# Patient Record
Sex: Male | Born: 1955 | Race: White | Hispanic: No | Marital: Married | Smoking: Never smoker
Health system: Southern US, Community
[De-identification: ages and names within clinical notes are randomized; demographics above are authoritative.]

## PROBLEM LIST (undated history)

## (undated) DIAGNOSIS — N401 Enlarged prostate with lower urinary tract symptoms: Secondary | ICD-10-CM

## (undated) DIAGNOSIS — E559 Vitamin D deficiency, unspecified: Secondary | ICD-10-CM

## (undated) DIAGNOSIS — N138 Other obstructive and reflux uropathy: Secondary | ICD-10-CM

## (undated) DIAGNOSIS — Z1211 Encounter for screening for malignant neoplasm of colon: Secondary | ICD-10-CM

## (undated) DIAGNOSIS — E782 Mixed hyperlipidemia: Secondary | ICD-10-CM

## (undated) DIAGNOSIS — M7040 Prepatellar bursitis, unspecified knee: Secondary | ICD-10-CM

## (undated) HISTORY — PX: LIPOMA EXCISION: SHX5283

## (undated) HISTORY — DX: Other obstructive and reflux uropathy: N40.1

## (undated) HISTORY — PX: OTHER SURGICAL HISTORY: SHX169

## (undated) HISTORY — DX: Benign prostatic hyperplasia with lower urinary tract symptoms: N13.8

## (undated) HISTORY — DX: Vitamin D deficiency, unspecified: E55.9

## (undated) HISTORY — DX: Encounter for screening for malignant neoplasm of colon: Z12.11

## (undated) HISTORY — PX: APPENDECTOMY: SHX54

## (undated) HISTORY — DX: Prepatellar bursitis, unspecified knee: M70.40

## (undated) HISTORY — PX: BLEPHAROPLASTY: SUR158

## (undated) HISTORY — DX: Mixed hyperlipidemia: E78.2

---

## 2009-01-21 HISTORY — PX: COLONOSCOPY: SHX174

## 2019-08-22 DIAGNOSIS — Z8616 Personal history of COVID-19: Secondary | ICD-10-CM

## 2019-08-22 HISTORY — DX: Personal history of COVID-19: Z86.16

## 2019-09-02 ENCOUNTER — Other Ambulatory Visit: Payer: Self-pay

## 2019-09-02 ENCOUNTER — Encounter (HOSPITAL_COMMUNITY): Payer: Self-pay | Admitting: Obstetrics and Gynecology

## 2019-09-02 DIAGNOSIS — U071 COVID-19: Secondary | ICD-10-CM | POA: Insufficient documentation

## 2019-09-02 DIAGNOSIS — J1282 Pneumonia due to coronavirus disease 2019: Secondary | ICD-10-CM | POA: Diagnosis not present

## 2019-09-02 DIAGNOSIS — E559 Vitamin D deficiency, unspecified: Secondary | ICD-10-CM | POA: Insufficient documentation

## 2019-09-02 DIAGNOSIS — R05 Cough: Secondary | ICD-10-CM | POA: Diagnosis present

## 2019-09-02 NOTE — ED Triage Notes (Signed)
Patient reports from urgent care for a CTA of chest. Patient was told he had an elevated d-dimer, but patient is COVID positive since saturday

## 2019-09-03 ENCOUNTER — Emergency Department (HOSPITAL_COMMUNITY)
Admission: EM | Admit: 2019-09-03 | Discharge: 2019-09-03 | Disposition: A | Discharge status: Home / Self Care | Attending: Emergency Medicine | Admitting: Emergency Medicine

## 2019-09-03 ENCOUNTER — Emergency Department (HOSPITAL_COMMUNITY)

## 2019-09-03 DIAGNOSIS — J1282 Pneumonia due to coronavirus disease 2019: Secondary | ICD-10-CM

## 2019-09-03 LAB — CBC WITH DIFFERENTIAL/PLATELET
Abs Immature Granulocytes: 0.03 10*3/uL (ref 0.00–0.07)
Basophils Absolute: 0 10*3/uL (ref 0.0–0.1)
Basophils Relative: 1 %
Eosinophils Absolute: 0.1 10*3/uL (ref 0.0–0.5)
Eosinophils Relative: 1 %
HCT: 44.3 % (ref 39.0–52.0)
Hemoglobin: 14.6 g/dL (ref 13.0–17.0)
Immature Granulocytes: 1 %
Lymphocytes Relative: 18 %
Lymphs Abs: 1.2 10*3/uL (ref 0.7–4.0)
MCH: 29 pg (ref 26.0–34.0)
MCHC: 33 g/dL (ref 30.0–36.0)
MCV: 87.9 fL (ref 80.0–100.0)
Monocytes Absolute: 1.1 10*3/uL — ABNORMAL HIGH (ref 0.1–1.0)
Monocytes Relative: 17 %
Neutro Abs: 4 10*3/uL (ref 1.7–7.7)
Neutrophils Relative %: 62 %
Platelets: 191 10*3/uL (ref 150–400)
RBC: 5.04 MIL/uL (ref 4.22–5.81)
RDW: 13.2 % (ref 11.5–15.5)
WBC: 6.3 10*3/uL (ref 4.0–10.5)
nRBC: 0 % (ref 0.0–0.2)

## 2019-09-03 LAB — COMPREHENSIVE METABOLIC PANEL
ALT: 19 U/L (ref 0–44)
AST: 28 U/L (ref 15–41)
Albumin: 3.6 g/dL (ref 3.5–5.0)
Alkaline Phosphatase: 36 U/L — ABNORMAL LOW (ref 38–126)
Anion gap: 10 (ref 5–15)
BUN: 10 mg/dL (ref 8–23)
CO2: 24 mmol/L (ref 22–32)
Calcium: 8.4 mg/dL — ABNORMAL LOW (ref 8.9–10.3)
Chloride: 103 mmol/L (ref 98–111)
Creatinine, Ser: 0.83 mg/dL (ref 0.61–1.24)
GFR calc Af Amer: >60 mL/min (ref >60)
GFR calc non Af Amer: >60 mL/min (ref >60)
Glucose, Bld: 117 mg/dL — ABNORMAL HIGH (ref 70–99)
Potassium: 3.5 mmol/L (ref 3.5–5.1)
Sodium: 137 mmol/L (ref 135–145)
Total Bilirubin: 0.6 mg/dL (ref 0.3–1.2)
Total Protein: 6.5 g/dL (ref 6.5–8.1)

## 2019-09-03 LAB — D-DIMER, QUANTITATIVE: D-Dimer, Quant: 0.99 ug/mL-FEU — ABNORMAL HIGH (ref 0.00–0.50)

## 2019-09-03 LAB — TROPONIN I (HIGH SENSITIVITY): Troponin I (High Sensitivity): 13 ng/L (ref <18)

## 2019-09-03 MED ORDER — DEXAMETHASONE SODIUM PHOSPHATE 10 MG/ML IJ SOLN
10.0000 mg | Freq: Once | INTRAMUSCULAR | Status: AC
  Administered 2019-09-03: 10 mg via INTRAVENOUS
  Filled 2019-09-03: qty 1

## 2019-09-03 MED ORDER — SODIUM CHLORIDE (PF) 0.9 % IJ SOLN
INTRAMUSCULAR | Status: AC
  Filled 2019-09-03: qty 50

## 2019-09-03 MED ORDER — IOHEXOL 350 MG/ML SOLN
100.0000 mL | Freq: Once | INTRAVENOUS | Status: AC | PRN
  Administered 2019-09-03: 100 mL via INTRAVENOUS

## 2019-09-03 NOTE — Discharge Instructions (Signed)
Your testing is negative for blood clots.  Continue your quarantine at home as you are doing.  Follow-up with your doctor.  Use Tylenol or Motrin as needed for aches and fever. Return to the ED with increased chest pain, difficulty breathing, nausea, vomiting, any other concerns.

## 2019-09-03 NOTE — ED Notes (Signed)
Pt walked from triage to rm 22 02 state 92%

## 2019-09-03 NOTE — ED Provider Notes (Signed)
Ralph [name]   CSN: [id number] Arrival date & time: 09/02/19  1621     History Chief Complaint  Patient presents with  . COVID Positive    Ralph [name] is a 64 y.o. male.  Patient reports Covid symptoms were approximately the last 10 days.  He had a positive test on August 7.  He presented to urgent care today with increased productive cough, shortness of breath and chest pain with coughing.  He had an outpatient positive D-dimer and was referred to the ED for CT scan.  He states he continues to feel short of breath with frequent cough productive of yellow and green mucus.  Has had intermittent fever and chills at home as well as nausea.  Chest pain is only with coughing.  Wife is been sick with similar symptoms.  He has been waiting for over 12 hours before my evaluation and states he is lightheaded from not eating or drinking all day.  He is not a diabetic.  Denies any heart or lung history.  Denies any abdominal pain or chest pain.  Denies any pain with urination or blood in the urine. He was given prescriptions for Decadron, Hycodan, albuterol, Mucinex and Zyrtec which he has not started yet.  The history is provided by the patient.       History reviewed. No pertinent past medical history.  There are no problems to display for this patient.   History reviewed. No pertinent surgical history.     No family history on file.  Social History   Tobacco Use  . Smoking status: Not on file  Substance Use Topics  . Alcohol use: Yes    Comment: Social  . Drug use: Not Currently    Home Medications Prior to Admission medications   Not on File    Allergies    Patient has no allergy information on record.  Review of Systems   Review of Systems  Constitutional: Positive for activity change, appetite change, chills, fatigue and fever.  HENT: Positive for congestion and rhinorrhea.   Eyes: Negative for  visual disturbance.  Respiratory: Positive for cough and shortness of breath. Negative for chest tightness.   Cardiovascular: Negative for chest pain and leg swelling.  Gastrointestinal: Negative for abdominal pain, nausea and vomiting.  Genitourinary: Negative for dysuria and hematuria.  Musculoskeletal: Positive for arthralgias and myalgias.  Skin: Negative for wound.  Neurological: Positive for weakness and headaches. Negative for light-headedness.   all other systems are negative except as noted in the HPI and PMH.    Physical Exam Updated Vital Signs BP (!) 149/89 (BP Location: Left Arm)   Pulse 97   Temp 99.6 F (37.6 C) (Oral)   Resp 16   SpO2 93%   Physical Exam Vitals and nursing [name] reviewed.  Constitutional:      General: He is not in acute distress.    Appearance: He is well-developed.     Comments: No acute distress, speaking full sentences  HENT:     Head: Normocephalic and atraumatic.     Mouth/Throat:     Pharynx: No oropharyngeal exudate.  Eyes:     Conjunctiva/sclera: Conjunctivae normal.     Pupils: Pupils are equal, round, and reactive to light.  Neck:     Comments: No meningismus. Cardiovascular:     Rate and Rhythm: Normal rate and regular rhythm.     Heart sounds: Normal heart sounds. No murmur heard.   Pulmonary:  Effort: Pulmonary effort is normal. No respiratory distress.     Breath sounds: Normal breath sounds. No wheezing.  Abdominal:     Palpations: Abdomen is soft.     Tenderness: There is no abdominal tenderness. There is no guarding or rebound.  Musculoskeletal:        General: No tenderness. Normal range of motion.     Cervical back: Normal range of motion and neck supple.     Right lower leg: No edema.     Left lower leg: No edema.  Skin:    General: Skin is warm.     Capillary Refill: Capillary refill takes less than 2 seconds.  Neurological:     General: No focal deficit present.     Mental Status: He is alert and oriented  to person, place, and time. Mental status is at baseline.     Cranial Nerves: No cranial nerve deficit.     Motor: No abnormal muscle tone.     Coordination: Coordination normal.     Comments: No ataxia on finger to nose bilaterally. No pronator drift. 5/5 strength throughout. CN 2-12 intact.Equal grip strength. Sensation intact.   Psychiatric:        Behavior: Behavior normal.     ED Results / Procedures / Treatments   Labs (all labs ordered are listed, but only abnormal results are displayed) Labs Reviewed  COMPREHENSIVE METABOLIC PANEL - Abnormal; Notable for the following components:      Result Value   Glucose, Bld 117 (*)    Calcium 8.4 (*)    Alkaline Phosphatase 36 (*)    All other components within normal limits  D-DIMER, QUANTITATIVE (NOT AT [facility]) - Abnormal; Notable for the following components:   D-Dimer, Quant 0.99 (*)    All other components within normal limits  CBC WITH DIFFERENTIAL/PLATELET - Abnormal; Notable for the following components:   Monocytes Absolute 1.1 (*)    All other components within normal limits  TROPONIN I (HIGH SENSITIVITY)  TROPONIN I (HIGH SENSITIVITY)    EKG EKG Interpretation  Date/Time:  Friday September 03 2019 05:28:47 EDT Ventricular Rate:  78 PR Interval:    QRS Duration: 100 QT Interval:  380 QTC Calculation: 433 R Axis:   46 Text Interpretation: Sinus rhythm Borderline T abnormalities, inferior leads No previous ECGs available Confirmed by Glynn Octave [phone]) on 09/03/2019 5:39:52 AM   Radiology CT Angio Chest PE W and/or Wo Contrast  Result Date: 09/03/2019 CLINICAL DATA:  PE suspected, low intermediate probability. Positive D-dimer. COVID positive. EXAM: CT ANGIOGRAPHY CHEST WITH CONTRAST TECHNIQUE: Multidetector CT imaging of the chest was performed using the standard protocol during bolus administration of intravenous contrast. Multiplanar CT image reconstructions and MIPs were obtained to evaluate the vascular anatomy.  CONTRAST:  OMNIPAQUE IOHEXOL 350 MG/ML SOLN COMPARISON:  None. FINDINGS: Cardiovascular: Heart size is normal. Aorta and great vessel origins are within normal limits. No significant atherosclerotic disease, aneurysm, or stenosis is present. Pulmonary artery opacification is excellent. No focal filling defects are present to suggest pulmonary embolus. Mediastinum/Nodes: No significant mediastinal, hilar, or axillary adenopathy is present. The thoracic inlet is within normal limits. Esophagus is normal. Lungs/Pleura: Patchy peripheral airspace opacities are most prominent at the lung bases, consistent with COVID-19 pneumonia. No significant consolidation is present. Airways are patent. No significant pleural effusion or pneumothorax is present. Upper Abdomen: No acute abnormality. Musculoskeletal: Vertebral body heights and alignment are normal. Endplate Schmorl's nodes and exaggerated kyphosis are present in the thoracic  spine. Rightward curvature is noted. Ribs and sternum are unremarkable. Review of the MIP images confirms the above findings. IMPRESSION: 1. No pulmonary embolus. 2. Patchy peripheral airspace opacities are most prominent at the lung bases, consistent with COVID-19 pneumonia. 3. Exaggerated kyphosis and curvature of the thoracic spine. Electronically Signed   By: Marin Roberts M.D.   On: 09/03/2019 04:07    Procedures Procedures (including critical care time)  Medications Ordered in ED Medications  sodium chloride (PF) 0.9 % injection (has no administration in time range)  iohexol (OMNIPAQUE) 350 MG/ML injection 100 mL (100 mLs Intravenous Contrast Given 09/03/19 0353)    ED Course  I have reviewed the triage vital signs and the nursing notes.  Pertinent labs & imaging results that were available during my care of the patient were reviewed by me and considered in my medical decision making (see chart for details).    MDM Rules/Calculators/A&P                           Patient Covid positive and sent from urgent care with worsening shortness of breath and cough and elevated D-dimer.  He is in no distress and not hypoxic with clear lungs.  EKG is nonischemic.  Troponin is negative despite many hours of waiting in the ED.  Labs are reassuring.  CT scan shows no pulmonary embolism but does show peripheral airspace opacities consistent with Covid.  Patient with no increased work of breathing.  No respiratory distress.  He is able to ambulate without desaturation.  He was prescribed Decadron in urgent care yesterday.  He is not having hypoxia in the ED.  Low suspicion for pulmonary embolism or ACS.  He does not appear to have any qualifications for monoclonal antibody infusion.  He has been sick for more than 10 days.  He declines monoclonal antibody infusion.  Discussed home quarantine.  Continue antitussives that were prescribed by urgent care.  He was also given Decadron which will be questionably beneficial at this point. Return to the ED with difficulty breathing, chest pain, any concerns   Kapono Luhn [name] was evaluated in Emergency Department on 09/03/2019 for the symptoms described in the history of present illness. He was evaluated in the context of the global COVID-19 pandemic, which necessitated consideration that the patient might be at risk for infection with the SARS-CoV-2 virus that causes COVID-19. Institutional protocols and algorithms that pertain to the evaluation of patients at risk for COVID-19 are in a state of rapid change based on information released by regulatory bodies including the CDC and federal and state organizations. These policies and algorithms were followed during the patient's care in the ED.  Final Clinical Impression(s) / ED Diagnoses Final diagnoses:  Pneumonia due to COVID-19 virus    Rx / DC Orders ED Discharge Orders    None       Circe Chilton, Jeannett Senior, MD 09/03/19 [phone]

## 2019-09-03 NOTE — ED Notes (Signed)
Pt is concerned about medication prescribed at [facility] yesterday.  Will review chart.  NAD noted.  Denies pain and SOB.

## 2019-09-03 NOTE — ED Notes (Addendum)
Per staff, Pt has been given ginger ale and tolerated well.

## 2020-02-15 DIAGNOSIS — N138 Other obstructive and reflux uropathy: Secondary | ICD-10-CM | POA: Insufficient documentation

## 2020-02-15 DIAGNOSIS — R350 Frequency of micturition: Secondary | ICD-10-CM | POA: Insufficient documentation

## 2020-02-15 DIAGNOSIS — N401 Enlarged prostate with lower urinary tract symptoms: Secondary | ICD-10-CM | POA: Insufficient documentation

## 2020-02-15 DIAGNOSIS — R7303 Prediabetes: Secondary | ICD-10-CM | POA: Insufficient documentation

## 2020-11-20 ENCOUNTER — Ambulatory Visit: Admitting: Family Medicine

## 2020-11-27 ENCOUNTER — Encounter: Payer: Self-pay | Admitting: Family Medicine

## 2020-11-27 ENCOUNTER — Other Ambulatory Visit: Payer: Self-pay

## 2020-11-27 ENCOUNTER — Ambulatory Visit (INDEPENDENT_AMBULATORY_CARE_PROVIDER_SITE_OTHER): Admitting: Family Medicine

## 2020-11-27 VITALS — BP 143/87 | HR 61 | Temp 98.1°F | Ht 71.85 in | Wt 198.8 lb

## 2020-11-27 DIAGNOSIS — Z0000 Encounter for general adult medical examination without abnormal findings: Secondary | ICD-10-CM | POA: Diagnosis not present

## 2020-11-27 DIAGNOSIS — R030 Elevated blood-pressure reading, without diagnosis of hypertension: Secondary | ICD-10-CM

## 2020-11-27 DIAGNOSIS — Z1211 Encounter for screening for malignant neoplasm of colon: Secondary | ICD-10-CM

## 2020-11-27 DIAGNOSIS — E7800 Pure hypercholesterolemia, unspecified: Secondary | ICD-10-CM | POA: Diagnosis not present

## 2020-11-27 NOTE — Patient Instructions (Signed)

## 2020-11-27 NOTE — Progress Notes (Signed)
Office Note 11/27/2020  CC: Establish care, CPE   HPI:  Ralph [name] is a 65 y.o. male who is here to establish care and annual health maintenance exam. Patient's most recent primary MD: Dr. Adolph Pollack [facility]). Old records in EMR were reviewed prior to or during today's visit.  Patient states he is doing well. He exercises cardio and weight training regularly. He has a good diet.  Old records state he has a diagnosis of hypercholesterolemia.  He does report his triglycerides were elevated but it seems these were mainly elevated after having COVID and then they had decreased near normal per his report. He donates plasma every 2 weeks and says blood pressure checks there are consistently in the 120s over 70s.  Past Medical History:  Diagnosis Date   BPH with obstruction/lower urinary tract symptoms    Urol-Dr. Logan Bores   History of COVID-19 08/2019   Mixed hyperlipidemia    Vitamin D deficiency     Past Surgical History:  Procedure Laterality Date   APPENDECTOMY     BCC excision     2010   BLEPHAROPLASTY Bilateral    COLONOSCOPY  2011   2011 normal   LIPOMA EXCISION     arm    Family History  Problem Relation Age of Onset   Heart disease Mother    Hearing loss Mother     Social History   Socioeconomic History   Marital status: Married    Spouse name: Not on file   Number of children: Not on file   Years of education: Not on file   Highest education level: Not on file  Occupational History   Not on file  Tobacco Use   Smoking status: Not on file   Smokeless tobacco: Not on file  Substance and Sexual Activity   Alcohol use: Yes    Comment: Social   Drug use: Not Currently   Sexual activity: Yes  Other Topics Concern   Not on file  Social History Narrative   Married, 1 son and 1 daughter.   [place] area since 2003.   Education: HS   Occup: Microbiologist   No T/A/Ds   Social Determinants of Research scientist (physical sciences) Strain: Not on file  Food Insecurity: Not on file  Transportation Needs: Not on file  Physical Activity: Not on file  Stress: Not on file  Social Connections: Not on file  Intimate Partner Violence: Not on file    Outpatient Encounter Medications as of 11/27/2020  Medication Sig   alfuzosin (UROXATRAL) 10 MG 24 hr tablet    Cholecalciferol 1.25 MG (50000 UT) capsule Take by mouth.   COVID-19 mRNA vaccine, Moderna, 100 MCG/0.5ML injection    No facility-administered encounter medications on file as of 11/27/2020.    No Known Allergies  ROS Review of Systems  Constitutional:  Negative for appetite change, chills, fatigue and fever.  HENT:  Negative for congestion, dental problem, ear pain and sore throat.   Eyes:  Negative for discharge, redness and visual disturbance.  Respiratory:  Negative for cough, chest tightness, shortness of breath and wheezing.   Cardiovascular:  Negative for chest pain, palpitations and leg swelling.  Gastrointestinal:  Negative for abdominal pain, blood in stool, diarrhea, nausea and vomiting.  Genitourinary:  Negative for difficulty urinating, dysuria, flank pain, frequency, hematuria and urgency.  Musculoskeletal:  Negative for arthralgias, back pain, joint swelling, myalgias and neck stiffness.  Skin:  Negative for pallor and rash.  Neurological:  Negative for dizziness, speech difficulty, weakness and headaches.  Hematological:  Negative for adenopathy. Does not bruise/bleed easily.  Psychiatric/Behavioral:  Negative for confusion and sleep disturbance. The patient is not nervous/anxious.    PE; Blood pressure (!) 143/87, pulse 61, temperature 98.1 F (36.7 C), height 5' 11.85" (1.825 m), weight 198 lb 12.8 oz (90.2 kg), SpO2 97 %.Body mass index is 27.07 kg/m.  Gen: Alert, well appearing.  Patient is oriented to person, place, time, and situation. AFFECT: pleasant, lucid thought and speech. ENT: Ears: EACs clear, normal epithelium.  TMs  with good light reflex and landmarks bilaterally.  Eyes: no injection, icteris, swelling, or exudate.  EOMI, PERRLA. Nose: no drainage or turbinate edema/swelling.  No injection or focal lesion.  Mouth: lips without lesion/swelling.  Oral mucosa pink and moist.  Dentition intact and without obvious caries or gingival swelling.  Oropharynx without erythema, exudate, or swelling.  Neck: supple/nontender.  No LAD, mass, or TM.  Carotid pulses 2+ bilaterally, without bruits. CV: RRR, no m/r/g.   LUNGS: CTA bilat, nonlabored resps, good aeration in all lung fields. ABD: soft, NT, ND, BS normal.  No hepatospenomegaly or mass.  No bruits. EXT: no clubbing, cyanosis, or edema.  Musculoskeletal: no joint swelling, erythema, warmth, or tenderness.  ROM of all joints intact. Skin - no sores or suspicious lesions or rashes or color changes  Pertinent labs:  No results found for: TSH Lab Results  Component Value Date   WBC 6.3 09/03/2019   HGB 14.6 09/03/2019   HCT 44.3 09/03/2019   MCV 87.9 09/03/2019   PLT 191 09/03/2019   Lab Results  Component Value Date   CREATININE 0.83 09/03/2019   BUN 10 09/03/2019   NA 137 09/03/2019   K 3.5 09/03/2019   CL 103 09/03/2019   CO2 24 09/03/2019   Lab Results  Component Value Date   ALT 19 09/03/2019   AST 28 09/03/2019   ALKPHOS 36 (L) 09/03/2019   BILITOT 0.6 09/03/2019   ASSESSMENT AND PLAN:   Establishing care.  1) HLD, mixed. No old #s at this time. Apparently has not been to the level of requiring meds. He'll return for fasting labs.  2) Elevated bp w/out dx HTN: continue routine monitoring at each plasma donation visit, call if elevated.  3) Health maintenance exam: Reviewed age and gender appropriate health maintenance issues (prudent diet, regular exercise, health risks of tobacco and excessive alcohol, use of seatbelts, fire alarms in home, use of sunscreen).  Also reviewed age and gender appropriate health screening as well as  vaccine recommendations. Vaccines: declined flu today.  Need to get old records. Labs: return for fasting health panel. Prostate ca screening:  he gets annual PSAs via his urologist (BPH) Colon ca screening: normal colonoscopy 2011.  Pt average risk--->he prefers to get cologuard so I ordered this today.  An After Visit Summary was printed and given to the patient.  Return in about 1 year (around 11/27/2021) for annual CPE (fasting).  Signed:  Crissie Sickles, MD           11/27/2020

## 2020-12-04 ENCOUNTER — Ambulatory Visit (INDEPENDENT_AMBULATORY_CARE_PROVIDER_SITE_OTHER)

## 2020-12-04 ENCOUNTER — Other Ambulatory Visit: Payer: Self-pay

## 2020-12-04 DIAGNOSIS — Z0000 Encounter for general adult medical examination without abnormal findings: Secondary | ICD-10-CM | POA: Diagnosis not present

## 2020-12-04 DIAGNOSIS — E7800 Pure hypercholesterolemia, unspecified: Secondary | ICD-10-CM

## 2020-12-04 LAB — COMPREHENSIVE METABOLIC PANEL
ALT: 15 U/L (ref 0–53)
AST: 16 U/L (ref 0–37)
Albumin: 4.5 g/dL (ref 3.5–5.2)
Alkaline Phosphatase: 60 U/L (ref 39–117)
BUN: 17 mg/dL (ref 6–23)
CO2: 30 mEq/L (ref 19–32)
Calcium: 9.2 mg/dL (ref 8.4–10.5)
Chloride: 103 mEq/L (ref 96–112)
Creatinine, Ser: 1.07 mg/dL (ref 0.40–1.50)
GFR: 74.12 mL/min (ref >60.00)
Glucose, Bld: 94 mg/dL (ref 70–99)
Potassium: 4.3 mEq/L (ref 3.5–5.1)
Sodium: 140 mEq/L (ref 135–145)
Total Bilirubin: 0.6 mg/dL (ref 0.2–1.2)
Total Protein: 6.3 g/dL (ref 6.0–8.3)

## 2020-12-04 LAB — CBC WITH DIFFERENTIAL/PLATELET
Basophils Absolute: 0.1 10*3/uL (ref 0.0–0.1)
Basophils Relative: 1.9 % (ref 0.0–3.0)
Eosinophils Absolute: 0.3 10*3/uL (ref 0.0–0.7)
Eosinophils Relative: 6 % — ABNORMAL HIGH (ref 0.0–5.0)
HCT: 46.5 % (ref 39.0–52.0)
Hemoglobin: 15.3 g/dL (ref 13.0–17.0)
Lymphocytes Relative: 28.2 % (ref 12.0–46.0)
Lymphs Abs: 1.5 10*3/uL (ref 0.7–4.0)
MCHC: 32.9 g/dL (ref 30.0–36.0)
MCV: 88.3 fl (ref 78.0–100.0)
Monocytes Absolute: 0.6 10*3/uL (ref 0.1–1.0)
Monocytes Relative: 11.4 % (ref 3.0–12.0)
Neutro Abs: 2.9 10*3/uL (ref 1.4–7.7)
Neutrophils Relative %: 52.5 % (ref 43.0–77.0)
Platelets: 204 10*3/uL (ref 150.0–400.0)
RBC: 5.26 Mil/uL (ref 4.22–5.81)
RDW: 13.6 % (ref 11.5–15.5)
WBC: 5.5 10*3/uL (ref 4.0–10.5)

## 2020-12-04 LAB — LIPID PANEL
Cholesterol: 188 mg/dL (ref 0–200)
HDL: 45.4 mg/dL (ref >39.00)
LDL Cholesterol: 129 mg/dL — ABNORMAL HIGH (ref 0–99)
NonHDL: 142.27
Total CHOL/HDL Ratio: 4
Triglycerides: 64 mg/dL (ref 0.0–149.0)
VLDL: 12.8 mg/dL (ref 0.0–40.0)

## 2020-12-04 LAB — TSH: TSH: 2.31 u[IU]/mL (ref 0.35–5.50)

## 2020-12-05 LAB — COLOGUARD: Cologuard: NEGATIVE

## 2020-12-10 LAB — COLOGUARD: COLOGUARD: NEGATIVE

## 2021-02-14 IMAGING — CT CT ANGIO CHEST
2 of 6 series · 18 of 36 positions shown · IV contrast (OMNIPAQUE 350)
Comparison: None.

CLINICAL DATA: PE suspected, low intermediate probability. Positive
D-dimer. COVID positive.

EXAM:
CT ANGIOGRAPHY CHEST WITH CONTRAST
TECHNIQUE: Multidetector CT imaging of the chest was performed using the
standard protocol during bolus administration of intravenous
contrast. Multiplanar CT image reconstructions and MIPs were
obtained to evaluate the vascular anatomy.
CONTRAST:  100mL OMNIPAQUE IOHEXOL 350 MG/ML SOLN

[Series 5: thins · axial · 0.73mm/px · z∈[+1404,+1665]mm · 17 of 295 slices shown]
[im 17/295  lung]
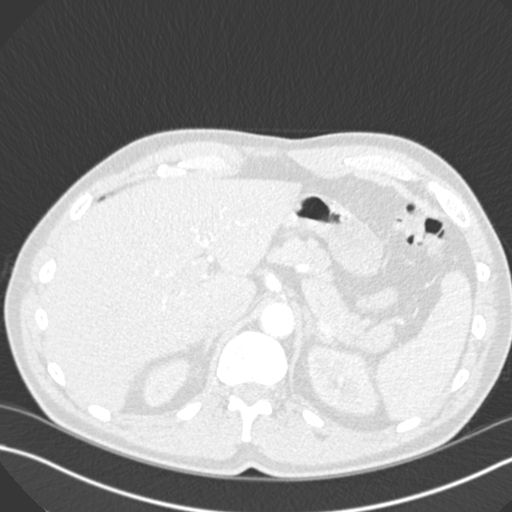
[im 33/295  mediastinal]
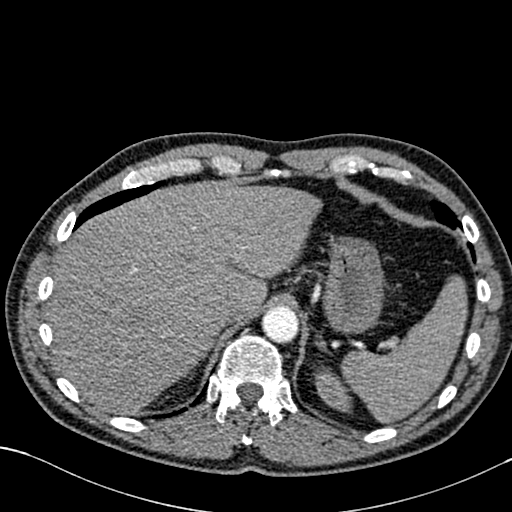
[im 50/295  lung]
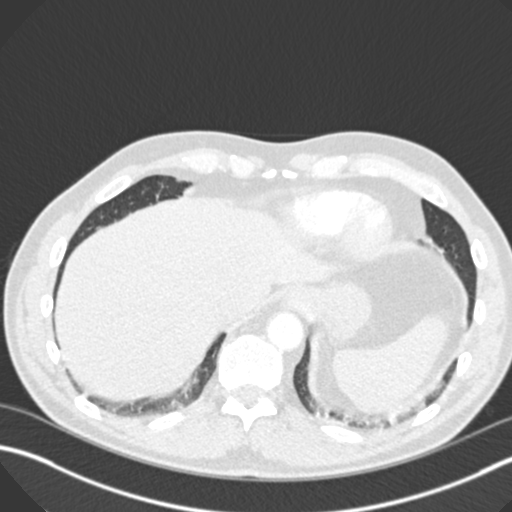
[im 66/295  mediastinal]
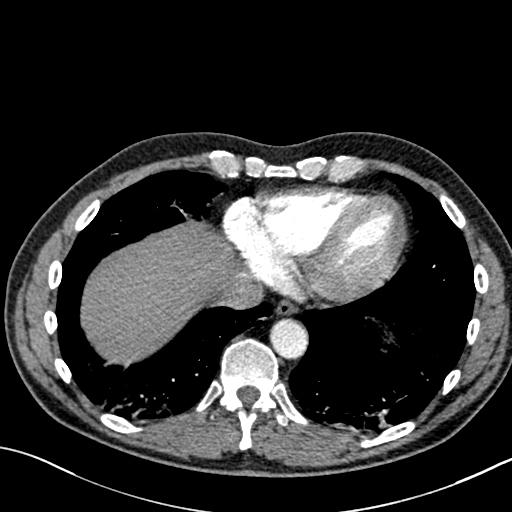
[im 82/295  lung]
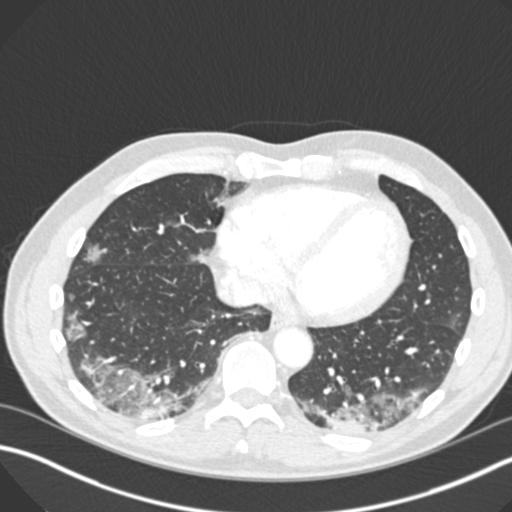
[im 99/295  mediastinal]
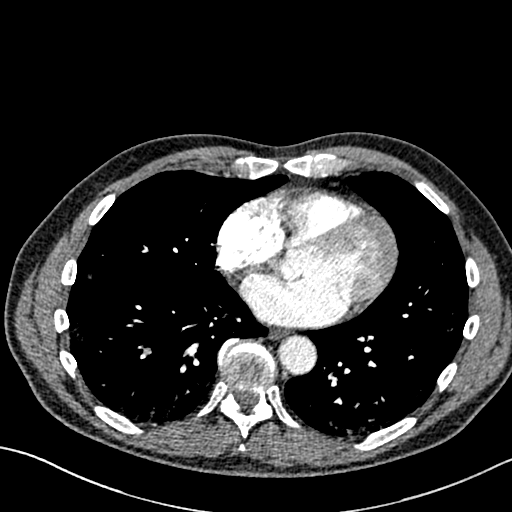
[im 115/295  lung]
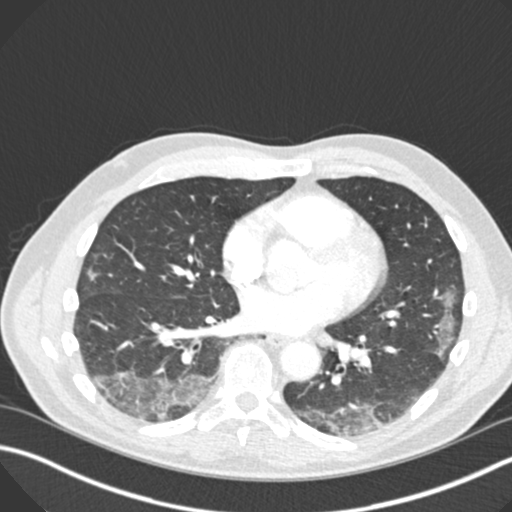
[im 131/295  mediastinal]
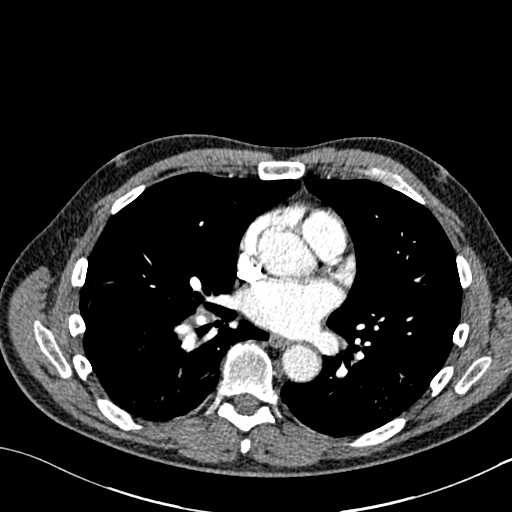
[im 148/295  lung]
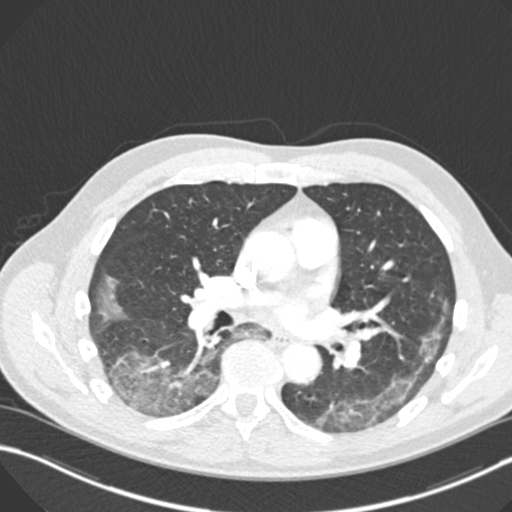
[im 164/295  mediastinal]
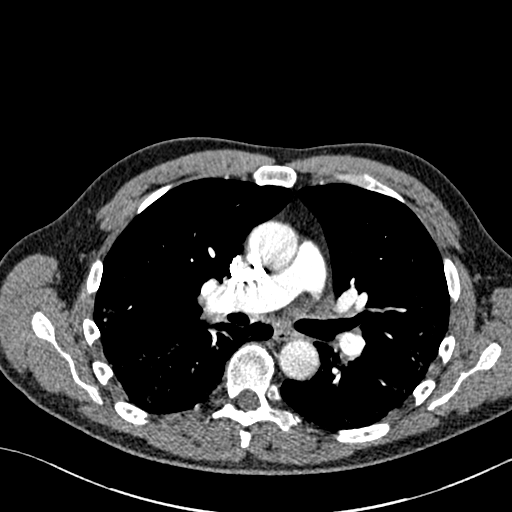
[im 180/295  lung]
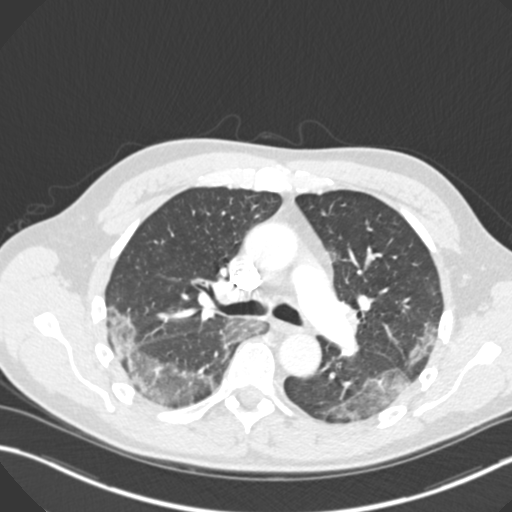
[im 197/295  mediastinal]
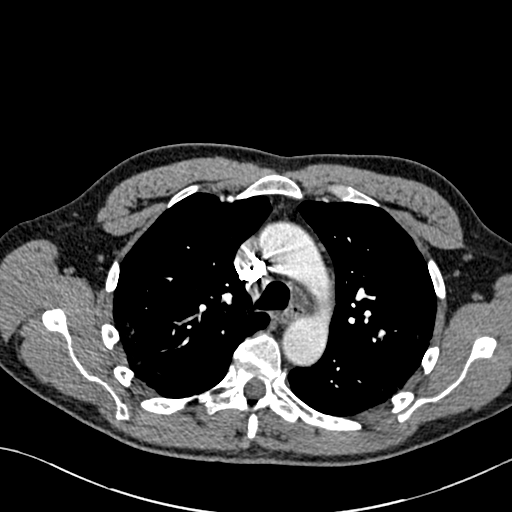
[im 213/295  lung]
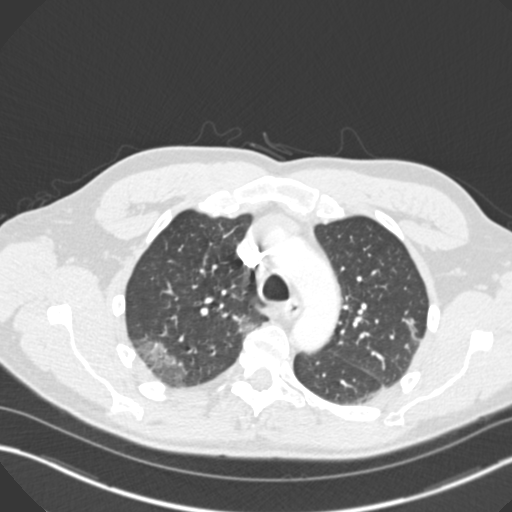
[im 229/295  mediastinal]
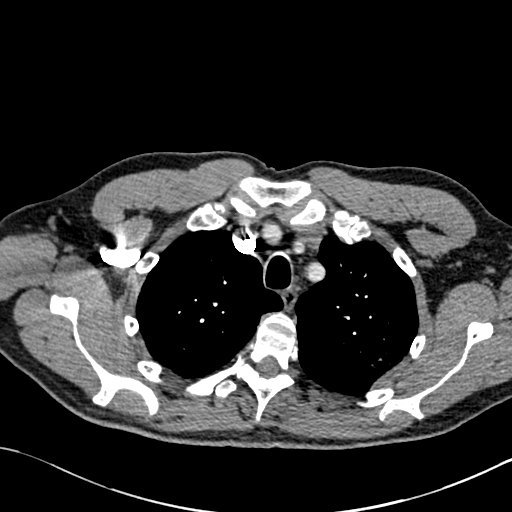
[im 245/295  lung]
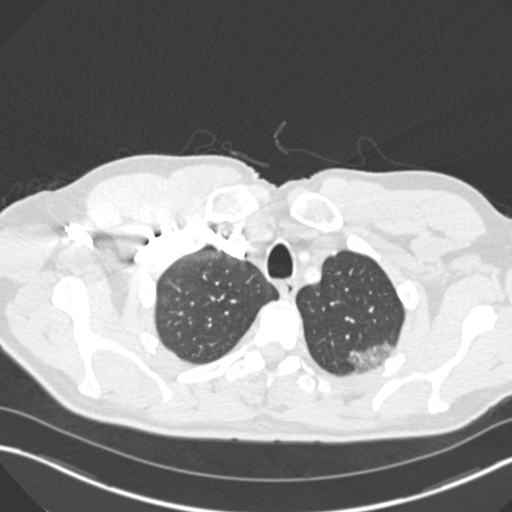
[im 262/295  mediastinal]
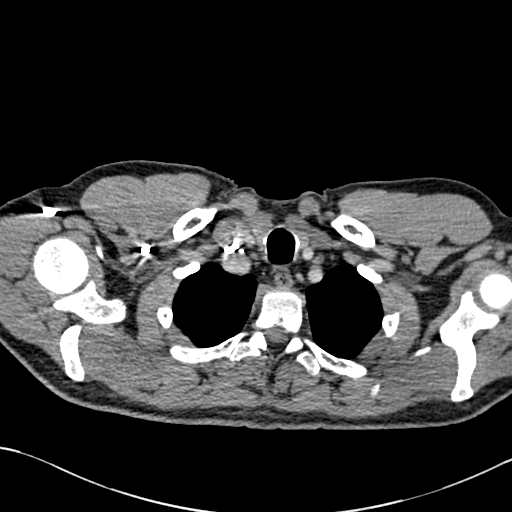
[im 278/295  lung]
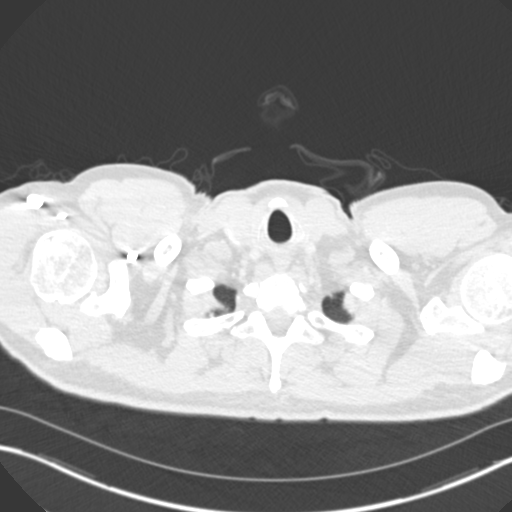

[Series 7: coronal mpr · coronal · 0.59mm/px · 1 of 120 slices shown]
[im 60/120  mediastinal]
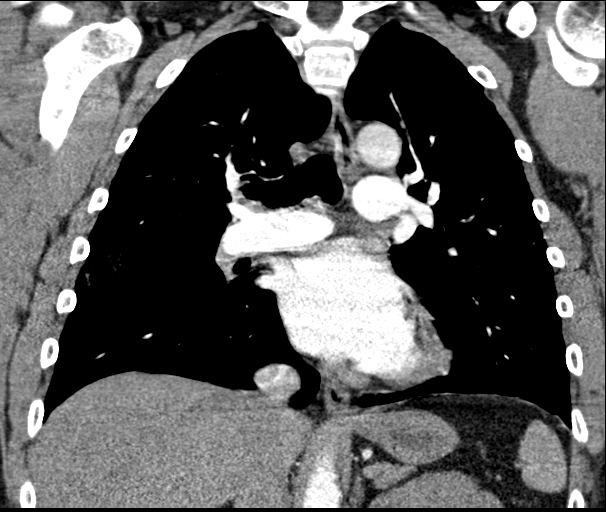

[18 of 36 positions shown; findings below may reference images not displayed]

FINDINGS: Cardiovascular: Heart size is normal. Aorta and great vessel origins
are within normal limits. No significant atherosclerotic disease,
aneurysm, or stenosis is present.

Pulmonary artery opacification is excellent. No focal filling
defects are present to suggest pulmonary embolus.

Mediastinum/Nodes: No significant mediastinal, hilar, or axillary
adenopathy is present. The thoracic inlet is within normal limits.
Esophagus is normal.

Lungs/Pleura: Patchy peripheral airspace opacities are most
prominent at the lung bases, consistent with 6SUPP-EY pneumonia. No
significant consolidation is present. Airways are patent. No
significant pleural effusion or pneumothorax is present.

Upper Abdomen: No acute abnormality.

Musculoskeletal: Vertebral body heights and alignment are normal.
Endplate Schmorl's nodes and exaggerated kyphosis are present in the
thoracic spine. Rightward curvature is noted. Ribs and sternum are
unremarkable.

Review of the MIP images confirms the above findings.
IMPRESSION: 1. No pulmonary embolus.
2. Patchy peripheral airspace opacities are most prominent at the
lung bases, consistent with 6SUPP-EY pneumonia.
3. Exaggerated kyphosis and curvature of the thoracic spine.

## 2021-03-07 ENCOUNTER — Ambulatory Visit (INDEPENDENT_AMBULATORY_CARE_PROVIDER_SITE_OTHER): Admitting: Family Medicine

## 2021-03-07 ENCOUNTER — Other Ambulatory Visit: Payer: Self-pay

## 2021-03-07 VITALS — BP 133/89 | HR 73 | Temp 97.9°F | Ht 71.85 in | Wt 196.6 lb

## 2021-03-07 DIAGNOSIS — G9331 Postviral fatigue syndrome: Secondary | ICD-10-CM | POA: Diagnosis not present

## 2021-03-07 DIAGNOSIS — J9801 Acute bronchospasm: Secondary | ICD-10-CM

## 2021-03-07 DIAGNOSIS — R052 Subacute cough: Secondary | ICD-10-CM

## 2021-03-07 MED ORDER — PREDNISONE 20 MG PO TABS: ORAL_TABLET | ORAL | 0 refills | Status: DC

## 2021-03-07 MED ORDER — BENZONATATE 200 MG PO CAPS: 200.0000 mg | ORAL_CAPSULE | Freq: Three times a day (TID) | ORAL | 1 refills | Status: DC | PRN

## 2021-03-07 NOTE — Progress Notes (Signed)
OFFICE VISIT  03/07/2021  CC:  Chief Complaint  Patient presents with   Cough    Persistent for 1 month. It seems to worsen when lying or sitting down for extended periods of time. Productive with green or white mucus at times. Using Mucinex or Zyrtec otc for relief   Patient is a 66 y.o. male who presents for cough.  HPI: Ralph [name] says he got a URI with cough about a month ago.  His URI symptoms cleared up but cough has persisted.  It waxes and wanes, seems to happen more when he is trying to sit quietly or or lays back.  No fevers chills or malaise.  No chest tightness, wheezing, or shortness of breath. He tried albuterol 2 puffs a couple times and says it did not make much difference. Taking Mucinex, sounds like it helps some but he feels a little bit of hangover effect after taking this medication.  Describes having similar illness may have 2022.  This got better with prednisone and use of albuterol.  Denies any history of exercise-induced wheezing or asthma.  Past Medical History:  Diagnosis Date   BPH with obstruction/lower urinary tract symptoms    [facility]Dr. Amalia [name]   History of COVID-19 08/2019   Mixed hyperlipidemia    Vitamin D deficiency     Past Surgical History:  Procedure Laterality Date   APPENDECTOMY     BCC excision     2010   BLEPHAROPLASTY Bilateral    COLONOSCOPY  2011   2011 normal   LIPOMA EXCISION     arm   Social History   Socioeconomic History   Marital status: Married    Spouse name: Not on file   Number of children: Not on file   Years of education: Not on file   Highest education level: Not on file  Occupational History   Not on file  Tobacco Use   Smoking status: Not on file   Smokeless tobacco: Not on file  Substance and Sexual Activity   Alcohol use: Yes    Comment: Social   Drug use: Not Currently   Sexual activity: Yes  Other Topics Concern   Not on file  Social History Narrative   Married, 1 son and 1 daughter.   [place] since  2003.   Education: HS   Occup: Artist   No T/A/Ds   Social Determinants of Radio broadcast assistant Strain: Unknown   Difficulty of Paying Living Expenses: Patient refused  Food Insecurity: Unknown   Worried About Charity fundraiser in the Last Year: Patient refused   Arboriculturist in the Last Year: Patient refused  Transportation Needs: Unknown   Film/video editor (Medical): Patient refused   Lack of Transportation (Non-Medical): Patient refused  Physical Activity: Insufficiently Active   Days of Exercise per Week: 3 days   Minutes of Exercise per Session: 30 min  Stress: Unknown   Feeling of Stress : Patient refused  Social Connections: Unknown   Frequency of Communication with Friends and Family: Patient refused   Frequency of Social Gatherings with Friends and Family: Patient refused   Attends Religious Services: Patient refused   Marine scientist or Organizations: Patient refused   Attends Archivist Meetings: Not on file   Marital Status: Patient refused    Outpatient Medications Prior to Visit  Medication Sig Dispense Refill   alfuzosin (UROXATRAL) 10 MG 24 hr tablet  Cholecalciferol 1.25 MG (50000 UT) capsule Take by mouth.     COVID-19 mRNA vaccine, Moderna, 100 MCG/0.5ML injection      No facility-administered medications prior to visit.    No Known Allergies  ROS As per HPI  PE: Vitals with BMI 03/07/2021 11/27/2020 09/03/2019  Height 5' 11.85" 5' 11.85" -  Weight 196 lbs 10 oz 198 lbs 13 oz -  BMI [id number] -  Systolic [id number]  Diastolic 89 87 89  Pulse 73 61 99     Physical Exam  Gen: Alert, well appearing.  Patient is oriented to person, place, time, and situation. AFFECT: pleasant, lucid thought and speech. [id number]: no injection, icteris, swelling, or exudate.  EOMI, PERRLA. Mouth: lips without lesion/swelling.  Oral mucosa pink and moist. Oropharynx without erythema, exudate, or swelling.   CV: RRR, no m/r/g.   LUNGS: CTA bilat except end exp coarse wheeze in L>R base.  Does not clear up with a few forceful coughs.  Nonlabored resps, good aeration in all lung fields.  Exp phase is not prolonged. EXT: no clubbing or cyanosis.  no edema.   LABS:  Last CBC Lab Results  Component Value Date   WBC 5.5 12/04/2020   HGB 15.3 12/04/2020   HCT 46.5 12/04/2020   MCV 88.3 12/04/2020   MCH 29.0 09/03/2019   RDW 13.6 12/04/2020   PLT 204.0 [id number]   Last metabolic panel Lab Results  Component Value Date   GLUCOSE 94 12/04/2020   NA 140 12/04/2020   K 4.3 12/04/2020   CL 103 12/04/2020   CO2 30 12/04/2020   BUN 17 12/04/2020   CREATININE 1.07 12/04/2020   GFRNONAA >60 09/03/2019   CALCIUM 9.2 12/04/2020   PROT 6.3 12/04/2020   ALBUMIN 4.5 12/04/2020   BILITOT 0.6 12/04/2020   ALKPHOS 60 12/04/2020   AST 16 12/04/2020   ALT 15 12/04/2020   ANIONGAP 10 09/03/2019    IMPRESSION AND PLAN:  Postviral cough--bronchospasm suspected. Low suspicion of any acute infection. Prednisone 40 mg a day x5 days, then 20 mg a day x5 days. Encouraged him to use his albuterol inhaler 2 puffs every 6 hours as needed. Tessalon Perles 200 mg 3 times daily as needed--May take in place of Mucinex.  An After Visit Summary was printed and given to the patient.  FOLLOW UP: Return if symptoms worsen or fail to improve.  Signed:  Crissie Sickles, MD           03/07/2021

## 2021-10-25 ENCOUNTER — Encounter: Payer: Self-pay | Admitting: Family Medicine

## 2021-10-25 ENCOUNTER — Ambulatory Visit: Admitting: Family Medicine

## 2021-10-25 VITALS — BP 159/95 | HR 55 | Temp 97.8°F | Ht 71.85 in | Wt 195.4 lb

## 2021-10-25 DIAGNOSIS — M7041 Prepatellar bursitis, right knee: Secondary | ICD-10-CM | POA: Diagnosis not present

## 2021-10-25 MED ORDER — MELOXICAM 15 MG PO TABS: 15.0000 mg | ORAL_TABLET | Freq: Every day | ORAL | 0 refills | Status: DC

## 2021-10-25 NOTE — Progress Notes (Signed)
OFFICE VISIT  10/25/2021  CC:  Chief Complaint  Patient presents with   Swelling    Right knee, denies injury. Swelling started 6 weeks ago. He does ice and elevation when he can. This does help some but he remains active so swelling returns. No tenderness but has pressure.    Patient is a 66 y.o. male who presents for right knee swelling.  HPI: 6 weeks ago he started to notice swelling in the anterior aspect of the right knee.  Describes it as a discomfort and fullness, only hurts when he puts a lot of pressure on it.  He has not been red or warm to touch.  He recalls no preceding injury but does a lot of bending work and often works with his knees directly on the ground as well. He has not had much success in being consistent with using compression and ice.  Has also tried maintaining a reasonable activity level during all this and this may be contributing to his lack of resolution with usual measures.  He takes ibuprofen infrequently. No history of any previous problem, no history of gout. He has felt well lately--no fever, malaise, fatigue, or weight loss.   Past Medical History:  Diagnosis Date   BPH with obstruction/lower urinary tract symptoms    [facility]-Dr. Logan Bores   History of COVID-19 08/2019   Mixed hyperlipidemia    Vitamin D deficiency     Past Surgical History:  Procedure Laterality Date   APPENDECTOMY     BCC excision     2010   BLEPHAROPLASTY Bilateral    COLONOSCOPY  2011   2011 normal   LIPOMA EXCISION     arm    Outpatient Medications Prior to Visit  Medication Sig Dispense Refill   alfuzosin (UROXATRAL) 10 MG 24 hr tablet      Cholecalciferol 1.25 MG (50000 UT) capsule Take by mouth.     benzonatate (TESSALON) 200 MG capsule Take 1 capsule (200 mg total) by mouth 3 (three) times daily as needed for cough. 20 capsule 1   predniSONE (DELTASONE) 20 MG tablet 2 tabs po qd x 5d then 1 tab po qd x 5d 15 tablet 0   No facility-administered medications prior to  visit.    No Known Allergies  ROS As per HPI  PE:    10/25/2021    9:47 AM 03/07/2021    4:02 PM 11/27/2020    3:04 PM  Vitals with BMI  Height 5' 11.85" 5' 11.85" 5' 11.85"  Weight 195 lbs 6 oz 196 lbs 10 oz 198 lbs 13 oz  BMI 26.61 26.77 27.07  Systolic 159 133 244  Diastolic 95 89 87  Pulse 55 73 61     Physical Exam  Gen: Alert, well appearing.  Patient is oriented to person, place, time, and situation. AFFECT: pleasant, lucid thought and speech. Right knee with prepatellar fullness and fluctuance.  No erythema or warmth.  No significant tenderness to palpation over this region.  Knee has full range of motion without instability.  LABS:  Last CBC Lab Results  Component Value Date   WBC 5.5 12/04/2020   HGB 15.3 12/04/2020   HCT 46.5 12/04/2020   MCV 88.3 12/04/2020   MCH 29.0 09/03/2019   RDW 13.6 12/04/2020   PLT 204.0 12/04/2020   Last metabolic panel Lab Results  Component Value Date   GLUCOSE 94 12/04/2020   NA 140 12/04/2020   K 4.3 12/04/2020   CL 103 12/04/2020  CO2 30 12/04/2020   BUN 17 12/04/2020   CREATININE 1.07 12/04/2020   GFRNONAA >60 09/03/2019   CALCIUM 9.2 12/04/2020   PROT 6.3 12/04/2020   ALBUMIN 4.5 12/04/2020   BILITOT 0.6 12/04/2020   ALKPHOS 60 12/04/2020   AST 16 12/04/2020   ALT 15 12/04/2020   ANIONGAP 10 09/03/2019   IMPRESSION AND PLAN:  Prepatellar bursitis of right knee. We will have him get a little more intense/aggressive with ice, compression, and elevation, and relative rest. Additionally, I aspirated the bursa today and sent the fluid for analysis--> cell count and differential, crystals, and culture. Meloxicam 15 mg once a day x10 days prescribed.  Bedside ultrasound today showed moderate-sized anechoic distention of the prepatellar bursa, some linear hypoechoic septae noted within this.  No suprapatellar effusion, no fluid in the lateral or medial joint space, Hoffa's fat pad without fluid.  Quadriceps and  patellar tendons normal.  Procedure: Real-time ultrasound guided patient of prepatellar bursa. Device: [employer] informed consent obtained.  Timeout conducted.  No overlying erythema, induration, or other signs of local infection. After sterile prep with Betadine, I used an 18-gauge 1-1/2 inch needle to aspirate 25 cc from prepatellar bursa, fluid appeared blood-tinged but clear, patient tolerated the procedure well.  No immediate complications.  Post-injection care discussed. Advised to call if fever/chills, erythema, drainage, or persistent bleeding. Impression: Technically successful ultrasound-guided prepatellar bursitis aspiration.  An After Visit Summary was printed and given to the patient.  FOLLOW UP: Return for 10-14d f/u R knee.  Signed:  Crissie Sickles, MD           10/25/2021

## 2021-10-26 LAB — TIQ-NTM

## 2021-10-26 LAB — SYNOVIAL FLUID, CRYSTAL

## 2021-10-31 ENCOUNTER — Encounter: Payer: Self-pay | Admitting: Family Medicine

## 2021-10-31 ENCOUNTER — Other Ambulatory Visit: Payer: Self-pay | Admitting: Family Medicine

## 2021-10-31 LAB — SYNOVIAL FLUID, CELL COUNT
Eos, Fluid: 1 %
Lining Cells, Synovial: 0 %
Lymphs, Fluid: 68 %
Macrophages Fld: 21 %
Nuc cell # Fld: 156 cells/uL (ref 0–200)
Polys, Fluid: 10 %
RBC, Fluid: 49000 /uL

## 2021-10-31 LAB — BODY FLUID CULTURE

## 2021-10-31 MED ORDER — AMOXICILLIN-POT CLAVULANATE 875-125 MG PO TABS: 1.0000 | ORAL_TABLET | Freq: Two times a day (BID) | ORAL | 0 refills | Status: DC

## 2021-11-09 ENCOUNTER — Ambulatory Visit: Admitting: Family Medicine

## 2021-11-09 ENCOUNTER — Ambulatory Visit (HOSPITAL_BASED_OUTPATIENT_CLINIC_OR_DEPARTMENT_OTHER)
Admission: RE | Admit: 2021-11-09 | Discharge: 2021-11-09 | Disposition: A | Discharge status: Home / Self Care | Source: Ambulatory Visit | Attending: Family Medicine | Admitting: Family Medicine

## 2021-11-09 ENCOUNTER — Encounter: Payer: Self-pay | Admitting: Family Medicine

## 2021-11-09 VITALS — BP 130/82 | HR 57 | Temp 98.0°F | Ht 71.85 in | Wt 192.8 lb

## 2021-11-09 DIAGNOSIS — M7041 Prepatellar bursitis, right knee: Secondary | ICD-10-CM

## 2021-11-09 DIAGNOSIS — M25561 Pain in right knee: Secondary | ICD-10-CM

## 2021-11-09 DIAGNOSIS — M1120 Other chondrocalcinosis, unspecified site: Secondary | ICD-10-CM | POA: Insufficient documentation

## 2021-11-09 DIAGNOSIS — M25461 Effusion, right knee: Secondary | ICD-10-CM

## 2021-11-09 MED ORDER — MELOXICAM 15 MG PO TABS: 15.0000 mg | ORAL_TABLET | Freq: Every day | ORAL | 0 refills | Status: DC

## 2021-11-09 MED ORDER — AMOXICILLIN-POT CLAVULANATE 875-125 MG PO TABS: 1.0000 | ORAL_TABLET | Freq: Two times a day (BID) | ORAL | 0 refills | Status: AC

## 2021-11-09 NOTE — Progress Notes (Signed)
OFFICE VISIT  11/09/2021  CC:  Chief Complaint  Patient presents with   Follow-up    Knee; did notice improvement when first taking abx and doing exercises. Swelling had went down but since then believes not improving as much as he was last week.   Patient is a 66 y.o. male who presents for 2-week f/u prepatellar bursitis.  INTERIM HX:  Fluid culture ended up growing Staph aureus that was sensitive to multiple antibiotics. I started him on Augmentin on 10/31/2021. The fluid also showed calcium pyrophosphate crystals.  Felt improvement when first started abx.  Rode stationary bike x 30 and got some return of pain and swelling--mild.  This has persisted.  Not a lot of discomfort.  No f/c/malaise.   Past Medical History:  Diagnosis Date   BPH with obstruction/lower urinary tract symptoms    [facility]-Dr. Amalia Hailey   Bursitis, prepatellar    10/2021 staph   History of COVID-19 08/2019   Mixed hyperlipidemia    Vitamin D deficiency     Past Surgical History:  Procedure Laterality Date   APPENDECTOMY     BCC excision     2010   BLEPHAROPLASTY Bilateral    COLONOSCOPY  2011   2011 normal   LIPOMA EXCISION     arm    Outpatient Medications Prior to Visit  Medication Sig Dispense Refill   alfuzosin (UROXATRAL) 10 MG 24 hr tablet      Cholecalciferol 1.25 MG (50000 UT) capsule Take by mouth.     amoxicillin-clavulanate (AUGMENTIN) 875-125 MG tablet Take 1 tablet by mouth 2 (two) times daily. 20 tablet 0   meloxicam (MOBIC) 15 MG tablet Take 1 tablet (15 mg total) by mouth daily. (Patient not taking: Reported on 11/09/2021) 10 tablet 0   No facility-administered medications prior to visit.    No Known Allergies  ROS As per HPI  PE:    11/09/2021   10:02 AM 10/25/2021    9:47 AM 03/07/2021    4:02 PM  Vitals with BMI  Height 5' 11.85" 5' 11.85" 5' 11.85"  Weight 192 lbs 13 oz 195 lbs 6 oz 196 lbs 10 oz  BMI 26.26 26.37 85.88  Systolic 502 774 128  Diastolic 82 95 89   Pulse 57 55 73     Physical Exam  Gen: Alert, well appearing.  Patient is oriented to person, place, time, and situation. R knee prepatellar bursa fluctuance.  No erythema or warmth.  No signif TTP. ROM of knee fully intact.  No effusion.  LABS:  Last CBC Lab Results  Component Value Date   WBC 5.5 12/04/2020   HGB 15.3 12/04/2020   HCT 46.5 12/04/2020   MCV 88.3 12/04/2020   MCH 29.0 09/03/2019   RDW 13.6 12/04/2020   PLT 204.0 78/67/6720   Last metabolic panel Lab Results  Component Value Date   GLUCOSE 94 12/04/2020   NA 140 12/04/2020   K 4.3 12/04/2020   CL 103 12/04/2020   CO2 30 12/04/2020   BUN 17 12/04/2020   CREATININE 1.07 12/04/2020   GFRNONAA >60 09/03/2019   CALCIUM 9.2 12/04/2020   PROT 6.3 12/04/2020   ALBUMIN 4.5 12/04/2020   BILITOT 0.6 12/04/2020   ALKPHOS 60 12/04/2020   AST 16 12/04/2020   ALT 15 12/04/2020   ANIONGAP 10 09/03/2019   IMPRESSION AND PLAN:  Prepatellar bursitis, infected. Clinically this is mild but would have expected swelling to resolve more by now. Out of an abundance of precaution  will continue his Augmentin for 7 more days, check knee radiographs, and ask sports medicine to see him. Also continue meloxicam 10 mg a day for the next 10 days.  An After Visit Summary was printed and given to the patient.  FOLLOW UP: Return for 10-14 d f/u knee.  Signed:  Santiago Bumpers, MD           11/09/2021

## 2021-11-12 NOTE — Progress Notes (Signed)
Yes I still recommend he see sports medicine.

## 2021-11-13 ENCOUNTER — Encounter: Payer: Self-pay | Admitting: Family Medicine

## 2021-11-13 ENCOUNTER — Ambulatory Visit: Admitting: Family Medicine

## 2021-11-13 VITALS — BP 120/82 | Ht 71.75 in | Wt 192.0 lb

## 2021-11-13 DIAGNOSIS — M7041 Prepatellar bursitis, right knee: Secondary | ICD-10-CM | POA: Diagnosis not present

## 2021-11-13 MED ORDER — PREDNISONE 5 MG PO TABS: ORAL_TABLET | ORAL | 0 refills | Status: DC

## 2021-11-13 NOTE — Assessment & Plan Note (Signed)
Acutely occurring.  Does not appear to be infected based on clinical exam today.  Culture did grow Staph aureus but synovial fluid analysis did not reveal any elevated white blood cell count. -Counseled on home exercise therapy and supportive care. -Counseled on taking a 14-day course of Augmentin -Initiate prednisone. -Counseled on heat and compression. -Could consider aspiration versus referral for debridement.

## 2021-11-13 NOTE — Progress Notes (Signed)
  Ralph [name] - 66 y.o. male MRN [id number]  Date of birth: 1955-11-05  SUBJECTIVE:  Including CC & ROS.  No chief complaint on file.   Ralph [name] is a 66 y.o. male that is presenting with acute right knee pain.  He has a large bursa appreciated on the anterior aspect of the knee.  No injury or inciting event.  He has had 1 aspiration.  He has completed 10 days of Augmentin with some improvement.  He has been continuing compression.  Review of the office note from 10/5 shows a bursa was drained. Review of the office note from 10/20 shows he was started on meloxicam and has had Augmentin for 2 occasions. Independent review of the synovial fluid analysis shows no crystals. Review of the body fluid culture from 10/5 shows Staph aureus. Independent review of the right knee x-ray from 10/20 shows a prepatellar bursitis.  Review of Systems See HPI   HISTORY: Past Medical, Surgical, Social, and Family History Reviewed & Updated per EMR.   Pertinent Historical Findings include:  Past Medical History:  Diagnosis Date   BPH with obstruction/lower urinary tract symptoms    [facility]-Dr. Amalia Hailey   Bursitis, prepatellar    10/2021 staph   History of COVID-19 08/2019   Mixed hyperlipidemia    Vitamin D deficiency     Past Surgical History:  Procedure Laterality Date   APPENDECTOMY     BCC excision     2010   BLEPHAROPLASTY Bilateral    COLONOSCOPY  2011   2011 normal   LIPOMA EXCISION     arm     PHYSICAL EXAM:  VS: BP 120/82 (BP Location: Left Arm, Patient Position: Sitting)   Ht 5' 11.75" (1.822 m)   Wt 192 lb (87.1 kg)   BMI 26.22 kg/m  Physical Exam Gen: NAD, alert, cooperative with exam, well-appearing MSK:  Neurovascularly intact       ASSESSMENT & PLAN:   Prepatellar bursitis of right knee Acutely occurring.  Does not appear to be infected based on clinical exam today.  Culture did grow Staph aureus but synovial fluid analysis did not  reveal any elevated white blood cell count. -Counseled on home exercise therapy and supportive care. -Counseled on taking a 14-day course of Augmentin -Initiate prednisone. -Counseled on heat and compression. -Could consider aspiration versus referral for debridement.

## 2021-11-13 NOTE — Patient Instructions (Signed)
Nice to meet you Please try heat  Please continue compression  Please complete a 14 day course of Augmentin   Please send me a message in MyChart with any questions or updates.  Please see me back in 2 weeks.   --Dr. Raeford Razor

## 2021-12-04 ENCOUNTER — Ambulatory Visit: Admitting: Family Medicine

## 2021-12-05 ENCOUNTER — Ambulatory Visit: Admitting: Family Medicine

## 2021-12-06 ENCOUNTER — Ambulatory Visit: Admitting: Family Medicine

## 2021-12-11 ENCOUNTER — Ambulatory Visit: Admitting: Family Medicine

## 2021-12-11 ENCOUNTER — Encounter: Payer: Self-pay | Admitting: Family Medicine

## 2021-12-11 VITALS — BP 126/78 | Ht 71.5 in | Wt 187.0 lb

## 2021-12-11 DIAGNOSIS — M67874 Other specified disorders of tendon, left ankle and foot: Secondary | ICD-10-CM | POA: Diagnosis not present

## 2021-12-11 DIAGNOSIS — M7041 Prepatellar bursitis, right knee: Secondary | ICD-10-CM

## 2021-12-11 NOTE — Assessment & Plan Note (Signed)
Acutely occurring. Intermittent in nature. Notices the pain more when he is standing up off of his bike seat  - counseled on home exercise therapy and supportive care - can consider custom orthotics or shockwave therapy

## 2021-12-11 NOTE — Progress Notes (Signed)
  Ralph [name] - 66 y.o. male MRN [id number]  Date of birth: [birth year]  SUBJECTIVE:  Including CC & ROS.  No chief complaint on file.   Ralph [name] is a 66 y.o. male that is  following for his right knee bursitis. Still has tenderness over the site but no redness and swelling has improved. Does get achilles pain intermittently.    Review of Systems See HPI   HISTORY: Past Medical, Surgical, Social, and Family History Reviewed & Updated per EMR.   Pertinent Historical Findings include:  Past Medical History:  Diagnosis Date   BPH with obstruction/lower urinary tract symptoms    [facility]-Dr. Logan Bores   Bursitis, prepatellar    10/2021 staph   History of COVID-19 08/2019   Mixed hyperlipidemia    Vitamin D deficiency     Past Surgical History:  Procedure Laterality Date   APPENDECTOMY     BCC excision     2010   BLEPHAROPLASTY Bilateral    COLONOSCOPY  2011   2011 normal   LIPOMA EXCISION     arm     PHYSICAL EXAM:  VS: BP 126/78 (BP Location: Left Arm, Patient Position: Sitting)   Ht 5' 11.5" (1.816 m)   Wt 187 lb (84.8 kg)   BMI 25.72 kg/m  Physical Exam Gen: NAD, alert, cooperative with exam, well-appearing MSK:  Neurovascularly intact       ASSESSMENT & PLAN:   Prepatellar bursitis of right knee Doing well. Mild tenderness. Swelling has improved  - counseled on home exercise therapy and supportive care - could consider shockwave therapy   Achilles tendinosis of left ankle Acutely occurring. Intermittent in nature. Notices the pain more when he is standing up off of his bike seat  - counseled on home exercise therapy and supportive care - can consider custom orthotics or shockwave therapy

## 2021-12-11 NOTE — Assessment & Plan Note (Signed)
Doing well. Mild tenderness. Swelling has improved  - counseled on home exercise therapy and supportive care - could consider shockwave therapy

## 2021-12-18 ENCOUNTER — Ambulatory Visit: Admitting: Family Medicine

## 2021-12-18 ENCOUNTER — Encounter: Payer: Self-pay | Admitting: Family Medicine

## 2021-12-18 VITALS — Ht 68.5 in | Wt 187.0 lb

## 2021-12-18 DIAGNOSIS — M67874 Other specified disorders of tendon, left ankle and foot: Secondary | ICD-10-CM

## 2021-12-18 NOTE — Assessment & Plan Note (Signed)
Completed orthotics today.  

## 2021-12-18 NOTE — Progress Notes (Signed)
  Ralph [name] - 66 y.o. male MRN [id number]  Date of birth: 1955-04-18  SUBJECTIVE:  Including CC & ROS.  No chief complaint on file.   Ralph [name] is a 66 y.o. male that is here for orthotics.    Review of Systems See HPI   HISTORY: Past Medical, Surgical, Social, and Family History Reviewed & Updated per EMR.   Pertinent Historical Findings include:  Past Medical History:  Diagnosis Date   BPH with obstruction/lower urinary tract symptoms    [facility]-Dr. Logan Bores   Bursitis, prepatellar    10/2021 staph   History of COVID-19 08/2019   Mixed hyperlipidemia    Vitamin D deficiency     Past Surgical History:  Procedure Laterality Date   APPENDECTOMY     BCC excision     2010   BLEPHAROPLASTY Bilateral    COLONOSCOPY  2011   2011 normal   LIPOMA EXCISION     arm     PHYSICAL EXAM:  VS: Ht 5' 8.5" (1.74 m)   Wt 187 lb (84.8 kg)   BMI 28.02 kg/m  Physical Exam Gen: NAD, alert, cooperative with exam, well-appearing MSK:  Neurovascularly intact    Patient was fitted for a standard, cushioned, semi-rigid orthotic. The orthotic was heated and afterward the patient stood on the orthotic blank positioned on the orthotic stand. The patient was positioned in subtalar neutral position and 10 degrees of ankle dorsiflexion in a weight bearing stance. After completion of molding, a stable base was applied to the orthotic blank. The blank was ground to a stable position for weight bearing. Size: 12 Pairs: 2 Base: Blue EVA Additional Posting and Padding: None The patient ambulated these, and they were very comfortable.     ASSESSMENT & PLAN:   Achilles tendinosis of left ankle Completed orthotics today

## 2021-12-24 ENCOUNTER — Encounter: Payer: Self-pay | Admitting: Family Medicine

## 2022-02-19 NOTE — Patient Instructions (Signed)
Health Maintenance, Male Adopting a healthy lifestyle and getting preventive care are important in promoting health and wellness. Ask your health care provider about: The right schedule for you to have regular tests and exams. Things you can do on your own to prevent diseases and keep yourself healthy. What should I know about diet, weight, and exercise? Eat a healthy diet  Eat a diet that includes plenty of vegetables, fruits, low-fat dairy products, and lean protein. Do not eat a lot of foods that are high in solid fats, added sugars, or sodium. Maintain a healthy weight Body mass index (BMI) is used to identify weight problems. It estimates body fat based on height and weight. Your health care provider can help determine your BMI and help you achieve or maintain a healthy weight. Get regular exercise Get regular exercise. This is one of the most important things you can do for your health. Most adults should: Exercise for at least 150 minutes each week. The exercise should increase your heart rate and make you sweat (moderate-intensity exercise). Do strengthening exercises at least twice a week. This is in addition to the moderate-intensity exercise. Spend less time sitting. Even light physical activity can be beneficial. Watch cholesterol and blood lipids Have your blood tested for lipids and cholesterol at 65 years of age, then have this test every 5 years. Have your cholesterol levels checked more often if: Your lipid or cholesterol levels are high. You are older than 65 years of age. You are at high risk for heart disease. What should I know about cancer screening? Depending on your health history and family history, you may need to have cancer screening at various ages. This may include screening for: Breast cancer. Cervical cancer. Colorectal cancer. Skin cancer. Lung cancer. What should I know about heart disease, diabetes, and high blood pressure? Blood pressure and heart  disease High blood pressure causes heart disease and increases the risk of stroke. This is more likely to develop in people who have high blood pressure readings or are overweight. Have your blood pressure checked: Every 3-5 years if you are 18-39 years of age. Every year if you are 40 years old or older. Diabetes Have regular diabetes screenings. This checks your fasting blood sugar level. Have the screening done: Once every three years after age 40 if you are at a normal weight and have a low risk for diabetes. More often and at a younger age if you are overweight or have a high risk for diabetes. What should I know about preventing infection? Hepatitis B If you have a higher risk for hepatitis B, you should be screened for this virus. Talk with your health care provider to find out if you are at risk for hepatitis B infection. Hepatitis C Testing is recommended for: Everyone born from 1945 through 1965. Anyone with known risk factors for hepatitis C. Sexually transmitted infections (STIs) Get screened for STIs, including gonorrhea and chlamydia, if: You are sexually active and are younger than 65 years of age. You are older than 65 years of age and your health care provider tells you that you are at risk for this type of infection. Your sexual activity has changed since you were last screened, and you are at increased risk for chlamydia or gonorrhea. Ask your health care provider if you are at risk. Ask your health care provider about whether you are at high risk for HIV. Your health care provider may recommend a prescription medicine to help prevent HIV   infection. If you choose to take medicine to prevent HIV, you should first get tested for HIV. You should then be tested every 3 months for as long as you are taking the medicine. Pregnancy If you are about to stop having your period (premenopausal) and you may become pregnant, seek counseling before you get pregnant. Take 400 to 800  micrograms (mcg) of folic acid every day if you become pregnant. Ask for birth control (contraception) if you want to prevent pregnancy. Osteoporosis and menopause Osteoporosis is a disease in which the bones lose minerals and strength with aging. This can result in bone fractures. If you are 4 years old or older, or if you are at risk for osteoporosis and fractures, ask your health care provider if you should: Be screened for bone loss. Take a calcium or vitamin D supplement to lower your risk of fractures. Be given hormone replacement therapy (HRT) to treat symptoms of menopause. Follow these instructions at home: Alcohol use Do not drink alcohol if: Your health care provider tells you not to drink. You are pregnant, may be pregnant, or are planning to become pregnant. If you drink alcohol: Limit how much you have to: 0-1 drink a day. Know how much alcohol is in your drink. In the [place] one drink equals one 12 oz bottle of beer (355 mL), one 5 oz glass of wine (148 mL), or one 1 oz glass of hard liquor (44 mL). Lifestyle Do not use any products that contain nicotine or tobacco. These products include cigarettes, chewing tobacco, and vaping devices, such as e-cigarettes. If you need help quitting, ask your health care provider. Do not use street drugs. Do not share needles. Ask your health care provider for help if you need support or information about quitting drugs. General instructions Schedule regular health, dental, and eye exams. Stay current with your vaccines. Tell your health care provider if: You often feel depressed. You have ever been abused or do not feel safe at home. Summary Adopting a healthy lifestyle and getting preventive care are important in promoting health and wellness. Follow your health care provider's instructions about healthy diet, exercising, and getting tested or screened for diseases. Follow your health care provider's instructions on monitoring your  cholesterol and blood pressure. This information is not intended to replace advice given to you by your health care provider. Make sure you discuss any questions you have with your health care provider. Document Revised: 05/29/2020 Document Reviewed: 05/29/2020 Elsevier Patient Education  [place] Maintenance, Male Adopting a healthy lifestyle and getting preventive care are important in promoting health and wellness. Ask your health care provider about: The right schedule for you to have regular tests and exams. Things you can do on your own to prevent diseases and keep yourself healthy. What should I know about diet, weight, and exercise? Eat a healthy diet  Eat a diet that includes plenty of vegetables, fruits, low-fat dairy products, and lean protein. Do not eat a lot of foods that are high in solid fats, added sugars, or sodium. Maintain a healthy weight Body mass index (BMI) is a measurement that can be used to identify possible weight problems. It estimates body fat based on height and weight. Your health care provider can help determine your BMI and help you achieve or maintain a healthy weight. Get regular exercise Get regular exercise. This is one of the most important things you can do for your health. Most adults should: Exercise for at least  150 minutes each week. The exercise should increase your heart rate and make you sweat (moderate-intensity exercise). Do strengthening exercises at least twice a week. This is in addition to the moderate-intensity exercise. Spend less time sitting. Even light physical activity can be beneficial. Watch cholesterol and blood lipids Have your blood tested for lipids and cholesterol at 65 years of age, then have this test every 5 years. You may need to have your cholesterol levels checked more often if: Your lipid or cholesterol levels are high. You are older than 65 years of age. You are at high risk for heart disease. What  should I know about cancer screening? Many types of cancers can be detected early and may often be prevented. Depending on your health history and family history, you may need to have cancer screening at various ages. This may include screening for: Colorectal cancer. Prostate cancer. Skin cancer. Lung cancer. What should I know about heart disease, diabetes, and high blood pressure? Blood pressure and heart disease High blood pressure causes heart disease and increases the risk of stroke. This is more likely to develop in people who have high blood pressure readings or are overweight. Talk with your health care provider about your target blood pressure readings. Have your blood pressure checked: Every 3-5 years if you are 73-26 years of age. Every year if you are 41 years old or older. If you are between the ages of 74 and 11 and are a current or former smoker, ask your health care provider if you should have a one-time screening for abdominal aortic aneurysm (AAA). Diabetes Have regular diabetes screenings. This checks your fasting blood sugar level. Have the screening done: Once every three years after age 58 if you are at a normal weight and have a low risk for diabetes. More often and at a younger age if you are overweight or have a high risk for diabetes. What should I know about preventing infection? Hepatitis B If you have a higher risk for hepatitis B, you should be screened for this virus. Talk with your health care provider to find out if you are at risk for hepatitis B infection. Hepatitis C Blood testing is recommended for: Everyone born from [birth year] through 1965. Anyone with known risk factors for hepatitis C. Sexually transmitted infections (STIs) You should be screened each year for STIs, including gonorrhea and chlamydia, if: You are sexually active and are younger than 65 years of age. You are older than 65 years of age and your health care provider tells you that you are  at risk for this type of infection. Your sexual activity has changed since you were last screened, and you are at increased risk for chlamydia or gonorrhea. Ask your health care provider if you are at risk. Ask your health care provider about whether you are at high risk for HIV. Your health care provider may recommend a prescription medicine to help prevent HIV infection. If you choose to take medicine to prevent HIV, you should first get tested for HIV. You should then be tested every 3 months for as long as you are taking the medicine. Follow these instructions at home: Alcohol use Do not drink alcohol if your health care provider tells you not to drink. If you drink alcohol: Limit how much you have to 0-2 drinks a day. Know how much alcohol is in your drink. In the [place] one drink equals one 12 oz bottle of beer (355 mL), one 5 oz glass of  wine (148 mL), or one 1 oz glass of hard liquor (44 mL). Lifestyle Do not use any products that contain nicotine or tobacco. These products include cigarettes, chewing tobacco, and vaping devices, such as e-cigarettes. If you need help quitting, ask your health care provider. Do not use street drugs. Do not share needles. Ask your health care provider for help if you need support or information about quitting drugs. General instructions Schedule regular health, dental, and eye exams. Stay current with your vaccines. Tell your health care provider if: You often feel depressed. You have ever been abused or do not feel safe at home. Summary Adopting a healthy lifestyle and getting preventive care are important in promoting health and wellness. Follow your health care provider's instructions about healthy diet, exercising, and getting tested or screened for diseases. Follow your health care provider's instructions on monitoring your cholesterol and blood pressure. This information is not intended to replace advice given to you by your health care provider.  Make sure you discuss any questions you have with your health care provider. Document Revised: 05/29/2020 Document Reviewed: 05/29/2020 Elsevier Patient Education  [place].

## 2022-02-20 ENCOUNTER — Encounter: Payer: Self-pay | Admitting: Family Medicine

## 2022-02-20 ENCOUNTER — Ambulatory Visit (INDEPENDENT_AMBULATORY_CARE_PROVIDER_SITE_OTHER): Admitting: Family Medicine

## 2022-02-20 VITALS — BP 129/81 | HR 54 | Temp 97.6°F | Ht 68.5 in | Wt 196.6 lb

## 2022-02-20 DIAGNOSIS — Z0000 Encounter for general adult medical examination without abnormal findings: Secondary | ICD-10-CM

## 2022-02-20 DIAGNOSIS — E7800 Pure hypercholesterolemia, unspecified: Secondary | ICD-10-CM | POA: Diagnosis not present

## 2022-02-20 LAB — COMPREHENSIVE METABOLIC PANEL
ALT: 15 U/L (ref 0–53)
AST: 18 U/L (ref 0–37)
Albumin: 4.5 g/dL (ref 3.5–5.2)
Alkaline Phosphatase: 45 U/L (ref 39–117)
BUN: 17 mg/dL (ref 6–23)
CO2: 31 mEq/L (ref 19–32)
Calcium: 9.2 mg/dL (ref 8.4–10.5)
Chloride: 102 mEq/L (ref 96–112)
Creatinine, Ser: 1.07 mg/dL (ref 0.40–1.50)
GFR: 73.5 mL/min (ref >60.00)
Glucose, Bld: 93 mg/dL (ref 70–99)
Potassium: 4.4 mEq/L (ref 3.5–5.1)
Sodium: 139 mEq/L (ref 135–145)
Total Bilirubin: 0.8 mg/dL (ref 0.2–1.2)
Total Protein: 6.4 g/dL (ref 6.0–8.3)

## 2022-02-20 LAB — CBC
HCT: 44.9 % (ref 39.0–52.0)
Hemoglobin: 15.1 g/dL (ref 13.0–17.0)
MCHC: 33.6 g/dL (ref 30.0–36.0)
MCV: 88.4 fl (ref 78.0–100.0)
Platelets: 212 10*3/uL (ref 150.0–400.0)
RBC: 5.08 Mil/uL (ref 4.22–5.81)
RDW: 14.3 % (ref 11.5–15.5)
WBC: 5.1 10*3/uL (ref 4.0–10.5)

## 2022-02-20 LAB — LIPID PANEL
Cholesterol: 193 mg/dL (ref 0–200)
HDL: 46.6 mg/dL (ref >39.00)
LDL Cholesterol: 130 mg/dL — ABNORMAL HIGH (ref 0–99)
NonHDL: 146.37
Total CHOL/HDL Ratio: 4
Triglycerides: 83 mg/dL (ref 0.0–149.0)
VLDL: 16.6 mg/dL (ref 0.0–40.0)

## 2022-02-20 LAB — TSH: TSH: 2.75 u[IU]/mL (ref 0.35–5.50)

## 2022-02-20 NOTE — Progress Notes (Signed)
Office Note 02/20/2022  CC:  Chief Complaint  Patient presents with   Annual Exam    Pt is fasting   HPI:  Patient is a 67 y.o. male who is here for annual health maintenance exam. Feeling well. Exercises great, diet good.  Past Medical History:  Diagnosis Date   BPH with obstruction/lower urinary tract symptoms    Urol-Dr. Amalia Hailey   Bursitis, prepatellar    10/2021 staph   History of COVID-19 08/2019   Mixed hyperlipidemia    Vitamin D deficiency     Past Surgical History:  Procedure Laterality Date   APPENDECTOMY     BCC excision     2010   BLEPHAROPLASTY Bilateral    COLONOSCOPY  2011   2011 normal   LIPOMA EXCISION     arm    Family History  Problem Relation Age of Onset   Heart disease Mother    Hearing loss Mother     Social History   Socioeconomic History   Marital status: Married    Spouse name: Not on file   Number of children: Not on file   Years of education: Not on file   Highest education level: Not on file  Occupational History   Not on file  Tobacco Use   Smoking status: Never   Smokeless tobacco: Not on file  Substance and Sexual Activity   Alcohol use: Yes    Comment: Social   Drug use: Not Currently   Sexual activity: Yes  Other Topics Concern   Not on file  Social History Narrative   Married, 1 son and 1 daughter.   [place] 2003.   Education: HS   Occup: Artist   No T/A/Ds   Social Determinants of Health   Financial Resource Strain: Unknown (03/07/2021)   Overall Financial Resource Strain (CARDIA)    Difficulty of Paying Living Expenses: Patient refused  Food Insecurity: Unknown (03/07/2021)   Hunger Vital Sign    Worried About Running Out of Food in the Last Year: Patient refused    [place] in the Last Year: Patient refused  Transportation Needs: Unknown (03/07/2021)   [place] - Transportation    Lack of Transportation (Medical): Patient refused    Lack of Transportation  (Non-Medical): Patient refused  Physical Activity: Insufficiently Active (03/07/2021)   Exercise Vital Sign    Days of Exercise per Week: 3 days    Minutes of Exercise per Session: 30 min  Stress: Unknown (03/07/2021)   [place]    Feeling of Stress : Patient refused  Social Connections: Unknown (03/07/2021)   Social Connection and Isolation Panel [NHANES]    Frequency of Communication with Friends and Family: Patient refused    Frequency of Social Gatherings with Friends and Family: Patient refused    Attends Religious Services: Patient refused    Marine scientist or Organizations: Patient refused    Attends Music therapist: Not on file    Marital Status: Patient refused  Intimate Partner Violence: Not on file    Outpatient Medications Prior to Visit  Medication Sig Dispense Refill   alfuzosin (UROXATRAL) 10 MG 24 hr tablet      Cholecalciferol 1.25 MG (50000 UT) capsule Take by mouth.     No facility-administered medications prior to visit.    No Known Allergies  Review of Systems  Constitutional:  Negative for appetite change, chills, fatigue and fever.  HENT:  Negative for congestion, dental problem, ear pain and sore throat.   Eyes:  Negative for discharge, redness and visual disturbance.  Respiratory:  Negative for cough, chest tightness, shortness of breath and wheezing.   Cardiovascular:  Negative for chest pain, palpitations and leg swelling.  Gastrointestinal:  Negative for abdominal pain, blood in stool, diarrhea, nausea and vomiting.  Genitourinary:  Negative for difficulty urinating, dysuria, flank pain, frequency, hematuria and urgency.  Musculoskeletal:  Negative for arthralgias, back pain, joint swelling, myalgias and neck stiffness.  Skin:  Negative for pallor and rash.  Neurological:  Negative for dizziness, speech difficulty, weakness and headaches.  Hematological:  Negative  for adenopathy. Does not bruise/bleed easily.  Psychiatric/Behavioral:  Negative for confusion and sleep disturbance. The patient is not nervous/anxious.     PE;    02/20/2022    8:23 AM 12/18/2021    3:49 PM 12/11/2021    8:53 AM  Vitals with BMI  Height 5' 8.5" 5' 8.5" 5' 11.5"  Weight 196 lbs 10 oz 187 lbs 187 lbs  BMI 29.45 06.23 76.28  Systolic 315  176  Diastolic 81  78  Pulse 54     Gen: Alert, well appearing.  Patient is oriented to person, place, time, and situation. AFFECT: pleasant, lucid thought and speech. ENT: Ears: EACs clear, normal epithelium.  TMs with good light reflex and landmarks bilaterally.  Eyes: no injection, icteris, swelling, or exudate.  EOMI, PERRLA. Nose: no drainage or turbinate edema/swelling.  No injection or focal lesion.  Mouth: lips without lesion/swelling.  Oral mucosa pink and moist.  Dentition intact and without obvious caries or gingival swelling.  Oropharynx without erythema, exudate, or swelling.  Neck: supple/nontender.  No LAD, mass, or TM.  Carotid pulses 2+ bilaterally, without bruits. CV: RRR, no m/r/g.   LUNGS: CTA bilat, nonlabored resps, good aeration in all lung fields. ABD: soft, NT, ND, BS normal.  No hepatospenomegaly or mass.  No bruits. EXT: no clubbing, cyanosis, or edema.  Musculoskeletal: no joint swelling, erythema, warmth, or tenderness.  ROM of all joints intact. Skin - no sores or suspicious lesions or rashes or color changes  Pertinent labs:  Lab Results  Component Value Date   TSH 2.31 12/04/2020   Lab Results  Component Value Date   WBC 5.5 12/04/2020   HGB 15.3 12/04/2020   HCT 46.5 12/04/2020   MCV 88.3 12/04/2020   PLT 204.0 12/04/2020   Lab Results  Component Value Date   CREATININE 1.07 12/04/2020   BUN 17 12/04/2020   NA 140 12/04/2020   K 4.3 12/04/2020   CL 103 12/04/2020   CO2 30 12/04/2020   Lab Results  Component Value Date   ALT 15 12/04/2020   AST 16 12/04/2020   ALKPHOS 60  12/04/2020   BILITOT 0.6 12/04/2020   Lab Results  Component Value Date   CHOL 188 12/04/2020   Lab Results  Component Value Date   HDL 45.40 12/04/2020   Lab Results  Component Value Date   LDLCALC 129 (H) 12/04/2020   Lab Results  Component Value Date   TRIG 64.0 12/04/2020   Lab Results  Component Value Date   CHOLHDL 4 12/04/2020    ASSESSMENT AND PLAN:   1) Health maintenance exam: Reviewed age and gender appropriate health maintenance issues (prudent diet, regular exercise, health risks of tobacco and excessive alcohol, use of seatbelts, fire alarms in home, use of sunscreen).  Also reviewed age and gender appropriate health  screening as well as vaccine recommendations. Vaccines: Tdap-->declined.  Shingrix-->declined. Labs: Fasting health panel.  He declined HIV and Hep C screening. Prostate ca screening:  he gets annual PSAs via his urologist (BPH)-->1.15 on 02/11/22. Colon ca screening: normal colonoscopy 2011.  Cologuard - November 2022.  Repeat Cologuard November 2025.  An After Visit Summary was printed and given to the patient.  FOLLOW UP:  Return in about 1 year (around 02/21/2023) for annual CPE (fasting).  Signed:  Crissie Sickles, MD           02/20/2022

## 2022-03-12 ENCOUNTER — Ambulatory Visit
Admission: RE | Admit: 2022-03-12 | Discharge: 2022-03-12 | Disposition: A | Discharge status: Home / Self Care | Source: Ambulatory Visit | Attending: Family Medicine | Admitting: Family Medicine

## 2022-03-12 VITALS — BP 133/76 | HR 87 | Temp 100.2°F | Resp 20

## 2022-03-12 DIAGNOSIS — Z20822 Contact with and (suspected) exposure to covid-19: Secondary | ICD-10-CM

## 2022-03-12 DIAGNOSIS — J069 Acute upper respiratory infection, unspecified: Secondary | ICD-10-CM | POA: Diagnosis not present

## 2022-03-12 LAB — POCT INFLUENZA A/B
Influenza A, POC: NEGATIVE
Influenza B, POC: NEGATIVE

## 2022-03-12 NOTE — ED Provider Notes (Signed)
[facility]    CSN: [id number] Arrival date & time: 03/12/22  0915      History   Chief Complaint Chief Complaint  Patient presents with   Cough    Entered by patient    HPI Ralph [name] is a 67 y.o. male.   Patient presenting today with 2-day history of chills, body aches, mild cough, congestion, rhinorrhea, ear pressure.  Denies chest pain, shortness of breath, abdominal pain, nausea vomiting or diarrhea.  Taking Zyrtec, NyQuil, using sinus rinses with mild temporary relief of symptoms.  Wife tested positive for COVID several days ago.  No known history of chronic pulmonary disease.    Past Medical History:  Diagnosis Date   BPH with obstruction/lower urinary tract symptoms    Urol-Dr. Amalia Hailey   Bursitis, prepatellar    10/2021 staph   History of COVID-19 08/2019   Mixed hyperlipidemia    Vitamin D deficiency     Patient Active Problem List   Diagnosis Date Noted   Achilles tendinosis of left ankle 12/11/2021   Prepatellar bursitis of right knee 11/13/2021   Increased frequency of urination 02/15/2020   Prediabetes 02/15/2020   BPH with obstruction/lower urinary tract symptoms 02/15/2020   Vitamin D deficiency 09/02/2019    Past Surgical History:  Procedure Laterality Date   APPENDECTOMY     BCC excision     2010   BLEPHAROPLASTY Bilateral    COLONOSCOPY  2011   2011 normal   LIPOMA EXCISION     arm       Home Medications    Prior to Admission medications   Medication Sig Start Date End Date Taking? Authorizing Provider  alfuzosin (UROXATRAL) 10 MG 24 hr tablet  12/09/19   [provider]  Cholecalciferol 1.25 MG (50000 UT) capsule Take by mouth. 10/15/19   [provider]    Family History Family History  Problem Relation Age of Onset   Heart disease Mother    Hearing loss Mother     Social History Social History   Tobacco Use   Smoking status: Never  Substance Use Topics   Alcohol use: Yes     Comment: Social   Drug use: Not Currently     Allergies   Patient has no known allergies.   Review of Systems Review of Systems Per HPI  Physical Exam Triage Vital Signs ED Triage Vitals  Enc Vitals Group     BP 03/12/22 0945 133/76     Pulse Rate 03/12/22 0945 87     Resp 03/12/22 0945 20     Temp 03/12/22 0945 100.2 F (37.9 C)     Temp Source 03/12/22 0945 Oral     SpO2 03/12/22 0945 97 %     Weight --      Height --      Head Circumference --      Peak Flow --      Pain Score 03/12/22 0948 0     Pain Loc --      Pain Edu? --      Excl. in [place]? --    No data found.  Updated Vital Signs BP 133/76 (BP Location: Right Arm)   Pulse 87   Temp 100.2 F (37.9 C) (Oral)   Resp 20   SpO2 97%   Visual Acuity Right Eye Distance:   Left Eye Distance:   Bilateral Distance:    Right Eye Near:   Left Eye Near:    Bilateral  Near:     Physical Exam Vitals and nursing note reviewed.  Constitutional:      Appearance: He is well-developed.  HENT:     Head: Atraumatic.     Right Ear: External ear normal.     Left Ear: External ear normal.     Nose: Rhinorrhea present.     Mouth/Throat:     Pharynx: Posterior oropharyngeal erythema present. No oropharyngeal exudate.  Eyes:     Conjunctiva/sclera: Conjunctivae normal.     Pupils: Pupils are equal, round, and reactive to light.  Cardiovascular:     Rate and Rhythm: Normal rate and regular rhythm.  Pulmonary:     Effort: Pulmonary effort is normal. No respiratory distress.     Breath sounds: No wheezing or rales.  Musculoskeletal:        General: Normal range of motion.     Cervical back: Normal range of motion and neck supple.  Lymphadenopathy:     Cervical: No cervical adenopathy.  Skin:    General: Skin is warm and dry.  Neurological:     Mental Status: He is alert and oriented to person, place, and time.  Psychiatric:        Behavior: Behavior normal.      UC Treatments / Results  Labs (all  labs ordered are listed, but only abnormal results are displayed) Labs Reviewed  SARS CORONAVIRUS 2 (TAT 6-24 HRS)  POCT INFLUENZA A/B    EKG   Radiology No results found.  Procedures Procedures (including critical care time)  Medications Ordered in UC Medications - No data to display  Initial Impression / Assessment and Plan / UC Course  I have reviewed the triage vital signs and the nursing notes.  Pertinent labs & imaging results that were available during my care of the patient were reviewed by me and considered in my medical decision making (see chart for details).     Low-grade fever in triage, otherwise vital signs reassuring.  Given symptoms and exposure to COVID, do highly suspect COVID-19.  COVID test pending, rapid flu negative for rule out and discussed supportive over-the-counter medications and home care.  Good candidate for Paxlovid if positive for COVID.  Final Clinical Impressions(s) / UC Diagnoses   Final diagnoses:  Viral URI  Exposure to COVID-19 virus     Discharge Instructions      Your flu test today was negative.  Your COVID test is pending and should be back in the morning.  Someone will call you if the results are positive and discuss if you are interested in antiviral medication.  In the meantime, take DayQuil, NyQuil, Flonase, sinus rinses, ibuprofen and Tylenol as needed for fever and body aches.  Drink lots of fluids, get lots of rest.    ED Prescriptions   None    PDMP not reviewed this encounter.   Volney American, [place] 03/12/22 1040

## 2022-03-12 NOTE — ED Triage Notes (Signed)
Pt reports his ears are clogged, runny nose, fatigue, nasal congestion, chills, slight cough x 2 days. Took zyrtec which gave slight relief  Pt left the house with a 97.8 temperature this morning. No fever last night. Pt's wife tested positive for covid 2 days ago.

## 2022-03-12 NOTE — Discharge Instructions (Signed)
Your flu test today was negative.  Your COVID test is pending and should be back in the morning.  Someone will call you if the results are positive and discuss if you are interested in antiviral medication.  In the meantime, take DayQuil, NyQuil, Flonase, sinus rinses, ibuprofen and Tylenol as needed for fever and body aches.  Drink lots of fluids, get lots of rest.

## 2022-03-13 ENCOUNTER — Telehealth (HOSPITAL_COMMUNITY): Payer: Self-pay | Admitting: Emergency Medicine

## 2022-03-13 LAB — SARS CORONAVIRUS 2 (TAT 6-24 HRS): SARS Coronavirus 2: POSITIVE — AB

## 2022-03-13 MED ORDER — NIRMATRELVIR/RITONAVIR (PAXLOVID)TABLET: 3.0000 | ORAL_TABLET | Freq: Two times a day (BID) | ORAL | 0 refills | Status: AC

## 2022-05-06 ENCOUNTER — Encounter: Payer: Self-pay | Admitting: *Deleted

## 2023-08-20 ENCOUNTER — Encounter: Payer: Self-pay | Admitting: Family Medicine

## 2023-08-20 ENCOUNTER — Ambulatory Visit: Admitting: Family Medicine

## 2023-08-20 ENCOUNTER — Other Ambulatory Visit: Payer: Self-pay | Admitting: Medical Genetics

## 2023-08-20 VITALS — BP 110/70 | HR 59 | Temp 98.1°F | Ht 68.5 in | Wt 196.4 lb

## 2023-08-20 DIAGNOSIS — E782 Mixed hyperlipidemia: Secondary | ICD-10-CM

## 2023-08-20 DIAGNOSIS — Z1211 Encounter for screening for malignant neoplasm of colon: Secondary | ICD-10-CM

## 2023-08-20 DIAGNOSIS — Z0000 Encounter for general adult medical examination without abnormal findings: Secondary | ICD-10-CM | POA: Diagnosis not present

## 2023-08-20 NOTE — Patient Instructions (Signed)
 Health Maintenance, Male  Adopting a healthy lifestyle and getting preventive care are important in promoting health and wellness. Ask your health care provider about:  The right schedule for you to have regular tests and exams.  Things you can do on your own to prevent diseases and keep yourself healthy.  What should I know about diet, weight, and exercise?  Eat a healthy diet    Eat a diet that includes plenty of vegetables, fruits, low-fat dairy products, and lean protein.  Do not eat a lot of foods that are high in solid fats, added sugars, or sodium.  Maintain a healthy weight  Body mass index (BMI) is a measurement that can be used to identify possible weight problems. It estimates body fat based on height and weight. Your health care provider can help determine your BMI and help you achieve or maintain a healthy weight.  Get regular exercise  Get regular exercise. This is one of the most important things you can do for your health. Most adults should:  Exercise for at least 150 minutes each week. The exercise should increase your heart rate and make you sweat (moderate-intensity exercise).  Do strengthening exercises at least twice a week. This is in addition to the moderate-intensity exercise.  Spend less time sitting. Even light physical activity can be beneficial.  Watch cholesterol and blood lipids  Have your blood tested for lipids and cholesterol at 66 years of age, then have this test every 5 years.  You may need to have your cholesterol levels checked more often if:  Your lipid or cholesterol levels are high.  You are older than 66 years of age.  You are at high risk for heart disease.  What should I know about cancer screening?  Many types of cancers can be detected early and may often be prevented. Depending on your health history and family history, you may need to have cancer screening at various ages. This may include screening for:  Colorectal cancer.  Prostate cancer.  Skin cancer.  Lung  cancer.  What should I know about heart disease, diabetes, and high blood pressure?  Blood pressure and heart disease  High blood pressure causes heart disease and increases the risk of stroke. This is more likely to develop in people who have high blood pressure readings or are overweight.  Talk with your health care provider about your target blood pressure readings.  Have your blood pressure checked:  Every 3-5 years if you are 9-95 years of age.  Every year if you are 85 years old or older.  If you are between the ages of 29 and 29 and are a current or former smoker, ask your health care provider if you should have a one-time screening for abdominal aortic aneurysm (AAA).  Diabetes  Have regular diabetes screenings. This checks your fasting blood sugar level. Have the screening done:  Once every three years after age 23 if you are at a normal weight and have a low risk for diabetes.  More often and at a younger age if you are overweight or have a high risk for diabetes.  What should I know about preventing infection?  Hepatitis B  If you have a higher risk for hepatitis B, you should be screened for this virus. Talk with your health care provider to find out if you are at risk for hepatitis B infection.  Hepatitis C  Blood testing is recommended for:  Everyone born from [birth year] through 1963.  Anyone  with known risk factors for hepatitis C.  Sexually transmitted infections (STIs)  You should be screened each year for STIs, including gonorrhea and chlamydia, if:  You are sexually active and are younger than 66 years of age.  You are older than 66 years of age and your health care provider tells you that you are at risk for this type of infection.  Your sexual activity has changed since you were last screened, and you are at increased risk for chlamydia or gonorrhea. Ask your health care provider if you are at risk.  Ask your health care provider about whether you are at high risk for HIV. Your health care provider  may recommend a prescription medicine to help prevent HIV infection. If you choose to take medicine to prevent HIV, you should first get tested for HIV. You should then be tested every 3 months for as long as you are taking the medicine.  Follow these instructions at home:  Alcohol use  Do not drink alcohol if your health care provider tells you not to drink.  If you drink alcohol:  Limit how much you have to 0-2 drinks a day.  Know how much alcohol is in your drink. In the [place] one drink equals one 12 oz bottle of beer (355 mL), one 5 oz glass of wine (148 mL), or one 1 oz glass of hard liquor (44 mL).  Lifestyle  Do not use any products that contain nicotine or tobacco. These products include cigarettes, chewing tobacco, and vaping devices, such as e-cigarettes. If you need help quitting, ask your health care provider.  Do not use street drugs.  Do not share needles.  Ask your health care provider for help if you need support or information about quitting drugs.  General instructions  Schedule regular health, dental, and eye exams.  Stay current with your vaccines.  Tell your health care provider if:  You often feel depressed.  You have ever been abused or do not feel safe at home.  Summary  Adopting a healthy lifestyle and getting preventive care are important in promoting health and wellness.  Follow your health care provider's instructions about healthy diet, exercising, and getting tested or screened for diseases.  Follow your health care provider's instructions on monitoring your cholesterol and blood pressure.  This information is not intended to replace advice given to you by your health care provider. Make sure you discuss any questions you have with your health care provider.  Document Revised: 05/29/2020 Document Reviewed: 05/29/2020  Elsevier Patient Education  2024 [employer].

## 2023-08-20 NOTE — Progress Notes (Signed)
 Office Note 08/20/2023  CC:  Chief Complaint  Patient presents with   Medical Management of Chronic Issues   HPI:  Patient is a 68 y.o. male who is here for annual visit for preventative health/cpe.  Ralph [name] feels good.  He exercises regularly and eats a healthy diet.  Past Medical History:  Diagnosis Date   BPH with obstruction/lower urinary tract symptoms    Urol-Dr. Janit   Bursitis, prepatellar    10/2021 staph   History of COVID-19 08/2019   Mixed hyperlipidemia    Vitamin D deficiency     Past Surgical History:  Procedure Laterality Date   APPENDECTOMY     BCC excision     2010   BLEPHAROPLASTY Bilateral    COLONOSCOPY  2011   2011 normal   LIPOMA EXCISION     arm    Family History  Problem Relation Age of Onset   Heart disease Mother    Hearing loss Mother     Social History   Socioeconomic History   Marital status: Married    Spouse name: Not on file   Number of children: Not on file   Years of education: Not on file   Highest education level: 12th grade  Occupational History   Not on file  Tobacco Use   Smoking status: Never   Smokeless tobacco: Not on file  Substance and Sexual Activity   Alcohol use: Yes    Comment: Social   Drug use: Not Currently   Sexual activity: Yes  Other Topics Concern   Not on file  Social History Narrative   Married, 1 son and 1 daughter.   [place] area since 2003.   Education: HS   Occup: Microbiologist   No T/A/Ds   Social Drivers of Corporate investment banker Strain: Low Risk  (08/19/2023)   Overall Financial Resource Strain (CARDIA)    Difficulty of Paying Living Expenses: Not hard at all  Food Insecurity: No Food Insecurity (08/19/2023)   Hunger Vital Sign    Worried About Running Out of Food in the Last Year: Never true    Ran Out of Food in the Last Year: Never true  Transportation Needs: No Transportation Needs (08/19/2023)   PRAPARE - Administrator, Civil Service  (Medical): No    Lack of Transportation (Non-Medical): No  Physical Activity: Sufficiently Active (08/19/2023)   Exercise Vital Sign    Days of Exercise per Week: 5 days    Minutes of Exercise per Session: 30 min  Stress: No Stress Concern Present (08/19/2023)   [employer] of Occupational Health - Occupational Stress Questionnaire    Feeling of Stress: Only a little  Social Connections: Socially Integrated (08/19/2023)   Social Connection and Isolation Panel    Frequency of Communication with Friends and Family: More than three times a week    Frequency of Social Gatherings with Friends and Family: Three times a week    Attends Religious Services: More than 4 times per year    Active Member of Clubs or Organizations: Yes    Attends Banker Meetings: More than 4 times per year    Marital Status: Married  Catering manager Violence: Not on file    Outpatient Medications Prior to Visit  Medication Sig Dispense Refill   alfuzosin (UROXATRAL) 10 MG 24 hr tablet      Cholecalciferol 1.25 MG (50000 UT) capsule Take by mouth.     finasteride (PROSCAR) 5 MG  tablet Take 5 mg by mouth daily.     No facility-administered medications prior to visit.    No Known Allergies  Review of Systems  Constitutional:  Negative for appetite change, chills, fatigue and fever.  HENT:  Negative for congestion, dental problem, ear pain and sore throat.   Eyes:  Negative for discharge, redness and visual disturbance.  Respiratory:  Negative for cough, chest tightness, shortness of breath and wheezing.   Cardiovascular:  Negative for chest pain, palpitations and leg swelling.  Gastrointestinal:  Negative for abdominal pain, blood in stool, diarrhea, nausea and vomiting.  Genitourinary:  Negative for difficulty urinating, dysuria, flank pain, frequency, hematuria and urgency.  Musculoskeletal:  Negative for arthralgias, back pain, joint swelling, myalgias and neck stiffness.  Skin:  Negative  for pallor and rash.  Neurological:  Negative for dizziness, speech difficulty, weakness and headaches.  Hematological:  Negative for adenopathy. Does not bruise/bleed easily.  Psychiatric/Behavioral:  Negative for confusion and sleep disturbance. The patient is not nervous/anxious.     PE;    08/20/2023    1:38 PM 03/12/2022    9:45 AM 02/20/2022    8:23 AM  Vitals with BMI  Height 5' 8.5  5' 8.5  Weight 196 lbs 6 oz  196 lbs 10 oz  BMI 29.42  29.45  Systolic 110 133 870  Diastolic 70 76 81  Pulse 59 87 54    Gen: Alert, well appearing.  Patient is oriented to person, place, time, and situation. AFFECT: pleasant, lucid thought and speech. ENT: Ears: EACs clear, normal epithelium.  TMs with good light reflex and landmarks bilaterally.  Eyes: no injection, icteris, swelling, or exudate.  EOMI, PERRLA. Nose: no drainage or turbinate edema/swelling.  No injection or focal lesion.  Mouth: lips without lesion/swelling.  Oral mucosa pink and moist.  Dentition intact and without obvious caries or gingival swelling.  Oropharynx without erythema, exudate, or swelling.  Neck: supple/nontender.  No LAD, mass, or TM.  Carotid pulses 2+ bilaterally, without bruits. CV: RRR, no m/r/g.   LUNGS: CTA bilat, nonlabored resps, good aeration in all lung fields. ABD: soft, NT, ND, BS normal.  No hepatospenomegaly or mass.  No bruits. EXT: no clubbing, cyanosis, or edema.  Musculoskeletal: no joint swelling, erythema, warmth, or tenderness.  ROM of all joints intact. Skin - no sores or suspicious lesions or rashes or color changes  Pertinent labs:  Lab Results  Component Value Date   TSH 2.75 02/20/2022   Lab Results  Component Value Date   WBC 5.1 02/20/2022   HGB 15.1 02/20/2022   HCT 44.9 02/20/2022   MCV 88.4 02/20/2022   PLT 212.0 02/20/2022   Lab Results  Component Value Date   CREATININE 1.07 02/20/2022   BUN 17 02/20/2022   NA 139 02/20/2022   K 4.4 02/20/2022   CL 102 02/20/2022    CO2 31 02/20/2022   Lab Results  Component Value Date   ALT 15 02/20/2022   AST 18 02/20/2022   ALKPHOS 45 02/20/2022   BILITOT 0.8 02/20/2022   Lab Results  Component Value Date   CHOL 193 02/20/2022   Lab Results  Component Value Date   HDL 46.60 02/20/2022   Lab Results  Component Value Date   LDLCALC 130 (H) 02/20/2022   Lab Results  Component Value Date   TRIG 83.0 02/20/2022   Lab Results  Component Value Date   CHOLHDL 4 02/20/2022    ASSESSMENT AND PLAN:   1  CPE/Preventative health care: Reviewed age and gender appropriate health maintenance issues (prudent diet, regular exercise, health risks of tobacco and excessive alcohol, use of seatbelts, fire alarms in home, use of sunscreen).  Also reviewed age and gender appropriate health screening as well as vaccine recommendations. Vaccines: Tdap-->declined.  Shingrix-->declined.  Prevnar-->declined. Labs: Fasting health panel.  He declined HIV and Hep C screening. Prostate ca screening:  he gets annual PSAs via his urologist (BPH).  Most recent PSA was 02/11/2023-->2.07. Colon ca screening: normal colonoscopy 2011.  Cologuard - November 2022.  Repeat Cologuard ordered today.  2 Hypercholesterolemia: He has a history of mildly elevated LDL (130 in January 2024). Monitor lipids.  An After Visit Summary was printed and given to the patient.  FOLLOW UP:  Return in about 1 year (around 08/19/2024) for annual CPE (fasting).  Signed:  Gerlene Hockey, MD           08/20/2023

## 2023-08-21 LAB — COMPREHENSIVE METABOLIC PANEL WITH GFR
ALT: 12 U/L (ref 0–53)
AST: 12 U/L (ref 0–37)
Albumin: 4.9 g/dL (ref 3.5–5.2)
Alkaline Phosphatase: 49 U/L (ref 39–117)
BUN: 20 mg/dL (ref 6–23)
CO2: 30 meq/L (ref 19–32)
Calcium: 9.5 mg/dL (ref 8.4–10.5)
Chloride: 101 meq/L (ref 96–112)
Creatinine, Ser: 1.05 mg/dL (ref 0.40–1.50)
GFR: 74.39 mL/min (ref >60.00)
Glucose, Bld: 80 mg/dL (ref 70–99)
Potassium: 4.8 meq/L (ref 3.5–5.1)
Sodium: 139 meq/L (ref 135–145)
Total Bilirubin: 0.6 mg/dL (ref 0.2–1.2)
Total Protein: 7.1 g/dL (ref 6.0–8.3)

## 2023-08-21 LAB — CBC WITH DIFFERENTIAL/PLATELET
Basophils Absolute: 0.1 K/uL (ref 0.0–0.1)
Basophils Relative: 1.3 % (ref 0.0–3.0)
Eosinophils Absolute: 0.2 K/uL (ref 0.0–0.7)
Eosinophils Relative: 3.4 % (ref 0.0–5.0)
HCT: 46.5 % (ref 39.0–52.0)
Hemoglobin: 15.2 g/dL (ref 13.0–17.0)
Lymphocytes Relative: 30.7 % (ref 12.0–46.0)
Lymphs Abs: 1.8 K/uL (ref 0.7–4.0)
MCHC: 32.7 g/dL (ref 30.0–36.0)
MCV: 88.3 fl (ref 78.0–100.0)
Monocytes Absolute: 0.7 K/uL (ref 0.1–1.0)
Monocytes Relative: 12.2 % — ABNORMAL HIGH (ref 3.0–12.0)
Neutro Abs: 3 K/uL (ref 1.4–7.7)
Neutrophils Relative %: 52.4 % (ref 43.0–77.0)
Platelets: 225 K/uL (ref 150.0–400.0)
RBC: 5.27 Mil/uL (ref 4.22–5.81)
RDW: 14.2 % (ref 11.5–15.5)
WBC: 5.7 K/uL (ref 4.0–10.5)

## 2023-08-21 LAB — LIPID PANEL
Cholesterol: 202 mg/dL — ABNORMAL HIGH (ref 0–200)
HDL: 52.6 mg/dL (ref >39.00)
LDL Cholesterol: 127 mg/dL — ABNORMAL HIGH (ref 0–99)
NonHDL: 149.69
Total CHOL/HDL Ratio: 4
Triglycerides: 112 mg/dL (ref 0.0–149.0)
VLDL: 22.4 mg/dL (ref 0.0–40.0)

## 2023-08-21 LAB — TSH: TSH: 3.09 u[IU]/mL (ref 0.35–5.50)

## 2023-08-22 ENCOUNTER — Ambulatory Visit: Payer: Self-pay | Admitting: Family Medicine

## 2023-08-22 DIAGNOSIS — Z9189 Other specified personal risk factors, not elsewhere classified: Secondary | ICD-10-CM

## 2023-08-26 DIAGNOSIS — Z1211 Encounter for screening for malignant neoplasm of colon: Secondary | ICD-10-CM

## 2023-08-29 NOTE — Telephone Encounter (Signed)
 CT cardiac scoring pending for review, please add dx code needed

## 2023-09-02 NOTE — Telephone Encounter (Signed)
 OK, signed

## 2023-09-18 ENCOUNTER — Ambulatory Visit (HOSPITAL_BASED_OUTPATIENT_CLINIC_OR_DEPARTMENT_OTHER)
Admission: RE | Admit: 2023-09-18 | Discharge: 2023-09-18 | Disposition: A | Discharge status: Home / Self Care | Payer: Self-pay | Source: Ambulatory Visit | Attending: Family Medicine | Admitting: Family Medicine

## 2023-09-18 DIAGNOSIS — Z9189 Other specified personal risk factors, not elsewhere classified: Secondary | ICD-10-CM | POA: Insufficient documentation

## 2023-09-23 ENCOUNTER — Ambulatory Visit: Payer: Self-pay | Admitting: Family Medicine

## 2023-09-23 ENCOUNTER — Encounter: Payer: Self-pay | Admitting: Family Medicine

## 2023-09-23 DIAGNOSIS — E782 Mixed hyperlipidemia: Secondary | ICD-10-CM

## 2023-09-23 MED ORDER — ATORVASTATIN CALCIUM 20 MG PO TABS: 20.0000 mg | ORAL_TABLET | Freq: Every day | ORAL | 2 refills | Status: DC

## 2023-11-14 ENCOUNTER — Other Ambulatory Visit: Payer: Self-pay | Admitting: Medical Genetics

## 2023-11-14 DIAGNOSIS — Z006 Encounter for examination for normal comparison and control in clinical research program: Secondary | ICD-10-CM

## 2023-12-10 ENCOUNTER — Ambulatory Visit (INDEPENDENT_AMBULATORY_CARE_PROVIDER_SITE_OTHER): Admitting: Family Medicine

## 2023-12-10 ENCOUNTER — Encounter: Payer: Self-pay | Admitting: Family Medicine

## 2023-12-10 VITALS — BP 136/76 | HR 58 | Temp 97.3°F | Ht 68.5 in | Wt 193.2 lb

## 2023-12-10 DIAGNOSIS — R931 Abnormal findings on diagnostic imaging of heart and coronary circulation: Secondary | ICD-10-CM

## 2023-12-10 DIAGNOSIS — K4090 Unilateral inguinal hernia, without obstruction or gangrene, not specified as recurrent: Secondary | ICD-10-CM | POA: Diagnosis not present

## 2023-12-10 DIAGNOSIS — I2510 Atherosclerotic heart disease of native coronary artery without angina pectoris: Secondary | ICD-10-CM

## 2023-12-10 DIAGNOSIS — E7800 Pure hypercholesterolemia, unspecified: Secondary | ICD-10-CM

## 2023-12-10 NOTE — Progress Notes (Signed)
 OFFICE VISIT  12/10/2023  CC:  Chief Complaint  Patient presents with   Possible Hernia    Abd discomfort; noticed bulge over 1 month ago, has been more pronounced; uncomfortable to stand over certain amount of time   Discuss Imaging    Pt had CT cardiac scoring 09/18/23    Patient is a 68 y.o. male who presents for possible hernia and he wants to discuss cholesterol/coronary calcium  data.  INTERIM HX: Ralph [name] feels well. He recently noted a bulge in the right groin about a month ago or so. He has been able to do his normal exercises including running/mountain biking and he has not had any getting groin pain or abdominal pain. He has noted, though, when he stands for a while at work he begins to feel a tugging and mildly uncomfortable feeling in the groin area. There has not been no significant increase in the size of the bulge since he first noted it. No testicular or scrotal swelling. No nausea, no voiding discomfort.  No change in bowel or bladder habits.  No fevers.   He has intermediate cardiovascular risk and we did a coronary calcium  score a couple of months ago and it was 60th percentile and I recommended a statin at that time. He has now been on atorvastatin  20 mg a day for the last 6 weeks.  Past Medical History:  Diagnosis Date   BPH with obstruction/lower urinary tract symptoms    Urol-Dr. Janit   Bursitis, prepatellar    10/2021 staph   History of COVID-19 08/2019   Mixed hyperlipidemia    +elev cor calc LAD and circ, 60th %tile--->recommended statin   Vitamin D deficiency     Past Surgical History:  Procedure Laterality Date   APPENDECTOMY     BCC excision     2010   BLEPHAROPLASTY Bilateral    COLONOSCOPY  2011   2011 normal   Coronary calcium  score     08/2023, elevated in LAD and circumflex, 60th percentile, statin recommended   LIPOMA EXCISION     arm    Outpatient Medications Prior to Visit  Medication Sig Dispense Refill   alfuzosin (UROXATRAL)  10 MG 24 hr tablet      atorvastatin  (LIPITOR) 20 MG tablet Take 1 tablet (20 mg total) by mouth daily. 30 tablet 2   Cholecalciferol 1.25 MG (50000 UT) capsule Take by mouth.     finasteride (PROSCAR) 5 MG tablet Take 5 mg by mouth daily.     No facility-administered medications prior to visit.    No Known Allergies  Review of Systems As per HPI  PE:    12/10/2023   10:44 AM 12/10/2023   10:34 AM 08/20/2023    1:38 PM  Vitals with BMI  Height  5' 8.5 5' 8.5  Weight  193 lbs 3 oz 196 lbs 6 oz  BMI  28.95 29.42  Systolic 136 146 889  Diastolic 76 80 70  Pulse  58 59     Physical Exam  Gen: Alert, well appearing.  Patient is oriented to person, place, time, and situation. AFFECT: pleasant, lucid thought and speech. Small bulge in the right inguinal region noted when he is standing.  This is reducible.  Nontender. Spontaneously reduces when he lies supine. Abdomen is flat soft and nontender. Scrotum/testicles normal.  LABS:  Last CBC Lab Results  Component Value Date   WBC 5.7 08/20/2023   HGB 15.2 08/20/2023   HCT 46.5 08/20/2023   MCV  88.3 08/20/2023   MCH 29.0 09/03/2019   RDW 14.2 08/20/2023   PLT 225.0 08/20/2023   Last metabolic panel Lab Results  Component Value Date   GLUCOSE 80 08/20/2023   NA 139 08/20/2023   K 4.8 08/20/2023   CL 101 08/20/2023   CO2 30 08/20/2023   BUN 20 08/20/2023   CREATININE 1.05 08/20/2023   GFR 74.39 08/20/2023   CALCIUM  9.5 08/20/2023   PROT 7.1 08/20/2023   ALBUMIN 4.9 08/20/2023   BILITOT 0.6 08/20/2023   ALKPHOS 49 08/20/2023   AST 12 08/20/2023   ALT 12 08/20/2023   ANIONGAP 10 09/03/2019   Last lipids Lab Results  Component Value Date   CHOL 202 (H) 08/20/2023   HDL 52.60 08/20/2023   LDLCALC 127 (H) 08/20/2023   TRIG 112.0 08/20/2023   CHOLHDL 4 08/20/2023   IMPRESSION AND PLAN:  #1 right inguinal hernia. He has some nagging discomfort when standing but otherwise asymptomatic and exercise is not  affected. We reviewed signs and symptoms of hernia incarceration/strangulation. At this point I will refer him to general surgery to discuss elective repair.  #2 hypercholesterolemia, elevated coronary calcium  score. He has been on a statin for 6 weeks. He will make a fasting lab appointment for repeat of his lipid panel. We spent some time reviewing his labs, coronary calcium  score testing, risk factor modification and monitoring, need for ongoing statin treatment.  An After Visit Summary was printed and given to the patient.  FOLLOW UP: Return for as needed.  Signed:  Gerlene Hockey, MD           12/10/2023

## 2023-12-10 NOTE — Patient Instructions (Signed)
 SABRA

## 2023-12-20 ENCOUNTER — Ambulatory Visit: Payer: Self-pay | Admitting: Family Medicine

## 2023-12-20 ENCOUNTER — Encounter: Payer: Self-pay | Admitting: Family Medicine

## 2023-12-20 LAB — COLOGUARD: COLOGUARD: NEGATIVE

## 2023-12-22 NOTE — H&P (View-Only) (Signed)
 REFERRING PHYSICIAN:  Candise Aleene DEL, MD PROVIDER:  LEONOR MACARIO DAWN, MD MRN: [id number] DOB: 10-03-1955 DATE OF ENCOUNTER: 12/23/2023 Subjective    Chief Complaint: New Consultation (unlateral inguinal hernia w/o obst or gang)   History of Present Illness: Ralph [name] is a 68 y.o. male who is seen today as an office consultation for evaluation of New Consultation (unlateral inguinal hernia w/o obst or gang)  He noticed a bulge in the right groin a few months ago. He has noticed it frequently since then, and it causes some discomfort but no pain. He has not had any symptoms on the left side. He is very active and is concerned about having issues with the hernia in the future.   He has hyperlipidemia but is otherwise in good health. His only prior abdominal surgery is an open appendectomy in 1972 for perforated appendicitis, and reports he had a drain for several weeks after surgery.    Review of Systems: A complete review of systems was obtained from the patient.  I have reviewed this information and discussed as appropriate with the patient.  See HPI as well for other ROS.   Medical History: Past Medical History:  Diagnosis Date   BPH with obstruction/lower urinary tract symptoms    Bursitis, prepatellar    Colon cancer screening    History of COVID-19    Hyperlipidemia    Vitamin D deficiency     There is no problem list on file for this patient.   Past Surgical History:  Procedure Laterality Date   COLONOSCOPY  2011    Blepharoplasty (Bilateral)       Lipoma excision      APPENDECTOMY     BCC excision [Other]     Coronary calcium  score [Other]       No Known Allergies  Current Outpatient Medications on File Prior to Visit  Medication Sig Dispense Refill   alfuzosin (UROXATRAL) 10 mg ER tablet Take 10 mg by mouth at bedtime     atorvastatin  (LIPITOR) 20 MG tablet Take 20 mg by mouth once daily     finasteride (PROSCAR) 5 mg tablet  Take 5 mg by mouth once daily     No current facility-administered medications on file prior to visit.    Family History  Problem Relation Age of Onset   Hearing loss Mother    Heart disease Mother      Social History   Tobacco Use  Smoking Status Never  Smokeless Tobacco Never     Social History   Socioeconomic History   Marital status: Married  Tobacco Use   Smoking status: Never   Smokeless tobacco: Never  Vaping Use   Vaping status: Never Used  Substance and Sexual Activity   Alcohol use: Never   Drug use: Never   Sexual activity: Yes    Partners: Female   Social Drivers of Corporate Investment Banker Strain: Low Risk  (12/08/2023)   Received from [facility]   Overall Financial Resource Strain (CARDIA)    How hard is it for you to pay for the very basics like food, housing, medical care, and heating?: Not hard at all  Food Insecurity: Low Risk  (12/22/2023)   Received from [facility]   Hunger Vital Sign    Within the past 12 months, you worried that your food would run out before you got money to buy more: Never true    Within the past 12 months, the food  you bought just didn't last and you didn't have money to get more. : Never true  Transportation Needs: No Transportation Needs (12/22/2023)   Received from [facility]    In the past 12 months, has lack of reliable transportation kept you from medical appointments, meetings, work or from getting things needed for daily living? : No  Physical Activity: Insufficiently Active (12/08/2023)   Received from [facility]   Exercise Vital Sign    On average, how many days per week do you engage in moderate to strenuous exercise (like a brisk walk)?: 3 days    On average, how many minutes do you engage in exercise at this level?: 40 min  Stress: No Stress Concern Present (12/08/2023)   Received from [facility] - Occupational Stress  Questionnaire    Do you feel stress - tense, restless, nervous, or anxious, or unable to sleep at night because your mind is troubled all the time - these days?: Only a little  Social Connections: Moderately Integrated (12/08/2023)   Received from [facility]   Social Connection and Isolation Panel    In a typical week, how many times do you talk on the phone with family, friends, or neighbors?: More than three times a week    How often do you get together with friends or relatives?: More than three times a week    How often do you attend church or religious services?: More than 4 times per year    Do you belong to any clubs or organizations such as church groups, unions, fraternal or athletic groups, or school groups?: No    Are you married, widowed, divorced, separated, never married, or living with a partner?: Married  Housing Stability: Unknown (12/23/2023)   Housing Stability Vital Sign    Homeless in the Last Year: No    Objective:   Vitals:   12/23/23 0845  BP: (!) 147/74  Pulse: 75  Temp: 36.5 C (97.7 F)  TempSrc: Temporal  SpO2: 99%  Weight: 86.7 kg (191 lb 3.2 oz)  Height: 182.9 cm (6')  PainSc: 0-No pain    Body mass index is 25.93 kg/m.  Physical Exam Vitals reviewed.  Constitutional:      General: He is not in acute distress.    Appearance: Normal appearance.  Pulmonary:     Effort: Pulmonary effort is normal. No respiratory distress.  Abdominal:     General: There is no distension.     Palpations: Abdomen is soft.     Comments: Well-healed RLQ surgical scar.  Genitourinary:    Comments: Small right inguinal hernia appreciable with valsalva. No signs of left inguinal hernia. Skin:    General: Skin is warm and dry.     Coloration: Skin is not jaundiced.  Neurological:     General: No focal deficit present.     Mental Status: He is alert and oriented to person, place, and time.        Assessment and Plan:     Diagnoses and all orders for  this visit:  Right inguinal hernia    68 yo male with a right inguinal hernia, which is symptomatic. We discussed inguinal hernia repair, both open and minimally invasive approaches. Given his previous perforated appendicitis and right-sided hernia, I would lean towards an open approach and patient is agreeable to this. I reviewed the details and risks of an open repair with mesh placement, including the benefits and  risks of infection, damage to inguinal structures, inguinodynia, and hernia recurrence. We also discussed the postop activity restrictions. He expressed understanding and consents to proceed with surgery.    SHELBY LYNN ALLEN, MD

## 2023-12-23 ENCOUNTER — Other Ambulatory Visit: Payer: Self-pay | Admitting: Family Medicine

## 2023-12-23 ENCOUNTER — Ambulatory Visit: Payer: Self-pay | Admitting: Surgery

## 2023-12-23 DIAGNOSIS — E782 Mixed hyperlipidemia: Secondary | ICD-10-CM

## 2023-12-24 NOTE — Telephone Encounter (Signed)
 No further action needed at this time.

## 2023-12-29 ENCOUNTER — Other Ambulatory Visit

## 2023-12-29 DIAGNOSIS — E782 Mixed hyperlipidemia: Secondary | ICD-10-CM

## 2023-12-29 LAB — LIPID PANEL
Cholesterol: 132 mg/dL (ref 0–200)
HDL: 47.9 mg/dL (ref >39.00)
LDL Cholesterol: 74 mg/dL (ref 0–99)
NonHDL: 84.36
Total CHOL/HDL Ratio: 3
Triglycerides: 52 mg/dL (ref 0.0–149.0)
VLDL: 10.4 mg/dL (ref 0.0–40.0)

## 2023-12-29 LAB — AST: AST: 16 U/L (ref 0–37)

## 2023-12-29 LAB — ALT: ALT: 19 U/L (ref 0–53)

## 2024-01-01 ENCOUNTER — Other Ambulatory Visit: Payer: Self-pay

## 2024-01-01 ENCOUNTER — Encounter (HOSPITAL_COMMUNITY): Payer: Self-pay | Admitting: Surgery

## 2024-01-01 NOTE — Progress Notes (Addendum)
 PCP - McGowen, Aleene DEL, MD  Cardiologist -   PPM/ICD - denies Device Orders - n/a Rep Notified - n/a  Chest x-ray - denies EKG - denies Stress Test - denies ECHO - denies Cardiac Cath - denies  CPAP - denies  Dm -denies  Blood Thinner Instructions: denies Aspirin Instructions: denies  ERAS Protcol - clear liquids until 9:30 am  COVID TEST- n/a  Anesthesia review: no  Patient verbally denies any shortness of breath, fever, cough and chest pain during phone call   -------------  SDW INSTRUCTIONS given:  Your procedure is scheduled on January 02, 2024.  Report to [facility] Main Entrance A at 10:00 A.M., and check in at the Admitting office.  Call this number if you have problems the morning of surgery:  [phone]   Remember:  Do not eat after midnight the night before your surgery  You may drink clear liquids until 9:30 the morning of your surgery.   Clear liquids allowed are: Water, Non-Citrus Juices (without pulp), Carbonated Beverages, Clear Tea, Black Coffee Only, and Gatorade    Take these medicines the morning of surgery with A SIP OF WATER  atorvastatin  (LIPITOR)  finasteride (PROSCAR)     As of today, STOP taking any Aspirin (unless otherwise instructed by your surgeon) Aleve, Naproxen, Ibuprofen, Motrin, Advil, Goody's, BC's, all herbal medications, fish oil, and all vitamins.                      Do not wear jewelry, make up, or nail polish            Do not wear lotions, powders, perfumes/colognes, or deodorant.            Do not shave 48 hours prior to surgery.  Men may shave face and neck.            Do not bring valuables to the hospital.            [facility] is not responsible for any belongings or valuables.  Do NOT Smoke (Tobacco/Vaping) 24 hours prior to your procedure If you use a CPAP at night, you may bring all equipment for your overnight stay.   Contacts, glasses, dentures or bridgework may not be worn into surgery.      For  patients admitted to the hospital, discharge time will be determined by your treatment team.   Patients discharged the day of surgery will not be allowed to drive home, and someone needs to stay with them for 24 hours.    Special instructions:   [place]- Preparing For Surgery  Before surgery, you can play an important role. Because skin is not sterile, your skin needs to be as free of germs as possible. You can reduce the number of germs on your skin by washing with CHG (chlorahexidine gluconate) Soap before surgery.  CHG is an antiseptic cleaner which kills germs and bonds with the skin to continue killing germs even after washing.    Oral Hygiene is also important to reduce your risk of infection.  Remember - BRUSH YOUR TEETH THE MORNING OF SURGERY WITH YOUR REGULAR TOOTHPASTE  Please do not use if you have an allergy to CHG or antibacterial soaps. If your skin becomes reddened/irritated stop using the CHG.  Do not shave (including legs and underarms) for at least 48 hours prior to first CHG shower. It is OK to shave your face.  Please follow these instructions carefully.   Shower  the NIGHT BEFORE SURGERY and the MORNING OF SURGERY with DIAL Soap.   Pat yourself dry with a CLEAN TOWEL.  Wear CLEAN PAJAMAS to bed the night before surgery  Place CLEAN SHEETS on your bed the night of your first shower and DO NOT SLEEP WITH PETS.   Day of Surgery: Please shower morning of surgery  Wear Clean/Comfortable clothing the morning of surgery Do not apply any deodorants/lotions.   Remember to brush your teeth WITH YOUR REGULAR TOOTHPASTE.   Questions were answered. Patient verbalized understanding of instructions.

## 2024-01-02 ENCOUNTER — Ambulatory Visit (HOSPITAL_BASED_OUTPATIENT_CLINIC_OR_DEPARTMENT_OTHER): Admitting: Certified Registered Nurse Anesthetist

## 2024-01-02 ENCOUNTER — Ambulatory Visit (HOSPITAL_COMMUNITY): Admission: RE | Admit: 2024-01-02 | Discharge: 2024-01-02 | Disposition: A | Attending: Surgery | Admitting: Surgery

## 2024-01-02 ENCOUNTER — Ambulatory Visit (HOSPITAL_COMMUNITY): Admitting: Certified Registered Nurse Anesthetist

## 2024-01-02 ENCOUNTER — Encounter (HOSPITAL_COMMUNITY): Payer: Self-pay | Admitting: Surgery

## 2024-01-02 ENCOUNTER — Ambulatory Visit: Payer: Self-pay | Admitting: Family Medicine

## 2024-01-02 ENCOUNTER — Encounter: Admission: RE | Disposition: A | Payer: Self-pay | Attending: Surgery

## 2024-01-02 DIAGNOSIS — K4090 Unilateral inguinal hernia, without obstruction or gangrene, not specified as recurrent: Secondary | ICD-10-CM

## 2024-01-02 DIAGNOSIS — E785 Hyperlipidemia, unspecified: Secondary | ICD-10-CM | POA: Diagnosis not present

## 2024-01-02 LAB — CBC
HCT: 45.8 % (ref 39.0–52.0)
Hemoglobin: 15.5 g/dL (ref 13.0–17.0)
MCH: 30 pg (ref 26.0–34.0)
MCHC: 33.8 g/dL (ref 30.0–36.0)
MCV: 88.8 fL (ref 80.0–100.0)
Platelets: 196 K/uL (ref 150–400)
RBC: 5.16 MIL/uL (ref 4.22–5.81)
RDW: 13.3 % (ref 11.5–15.5)
WBC: 5 K/uL (ref 4.0–10.5)
nRBC: 0 % (ref 0.0–0.2)

## 2024-01-02 SURGERY — REPAIR, HERNIA, INGUINAL, ADULT
Anesthesia: General | Site: Groin | Laterality: Right

## 2024-01-02 MED ORDER — FENTANYL CITRATE (PF) 250 MCG/5ML IJ SOLN
INTRAMUSCULAR | Status: AC
  Filled 2024-01-02: qty 5

## 2024-01-02 MED ORDER — FENTANYL CITRATE (PF) 100 MCG/2ML IJ SOLN
25.0000 ug | INTRAMUSCULAR | Status: DC | PRN
  Administered 2024-01-02: 50 ug via INTRAVENOUS

## 2024-01-02 MED ORDER — BUPIVACAINE-EPINEPHRINE (PF) 0.25% -1:200000 IJ SOLN
INTRAMUSCULAR | Status: AC
  Filled 2024-01-02: qty 30

## 2024-01-02 MED ORDER — PHENYLEPHRINE HCL-NACL 20-0.9 MG/250ML-% IV SOLN
INTRAVENOUS | Status: DC | PRN
  Administered 2024-01-02: 30 ug/min via INTRAVENOUS

## 2024-01-02 MED ORDER — ACETAMINOPHEN 500 MG PO TABS: 1000.0000 mg | ORAL_TABLET | ORAL | Status: AC

## 2024-01-02 MED ORDER — MIDAZOLAM HCL (PF) 2 MG/2ML IJ SOLN
INTRAMUSCULAR | Status: DC | PRN
  Administered 2024-01-02: 2 mg via INTRAVENOUS

## 2024-01-02 MED ORDER — DEXAMETHASONE SOD PHOSPHATE PF 10 MG/ML IJ SOLN
INTRAMUSCULAR | Status: DC | PRN
  Administered 2024-01-02: 10 mg via INTRAVENOUS

## 2024-01-02 MED ORDER — OXYCODONE HCL 5 MG PO TABS: 5.0000 mg | ORAL_TABLET | Freq: Four times a day (QID) | ORAL | 0 refills | Status: AC | PRN

## 2024-01-02 MED ORDER — CELECOXIB 200 MG PO CAPS: 200.0000 mg | ORAL_CAPSULE | ORAL | Status: AC

## 2024-01-02 MED ORDER — ONDANSETRON HCL 4 MG/2ML IJ SOLN
INTRAMUSCULAR | Status: DC | PRN
  Administered 2024-01-02: 4 mg via INTRAVENOUS

## 2024-01-02 MED ORDER — DROPERIDOL 2.5 MG/ML IJ SOLN: 0.6250 mg | Freq: Once | INTRAMUSCULAR | Status: DC | PRN

## 2024-01-02 MED ORDER — ROCURONIUM BROMIDE 100 MG/10ML IV SOLN
INTRAVENOUS | Status: DC | PRN
  Administered 2024-01-02: 60 mg via INTRAVENOUS
  Administered 2024-01-02: 10 mg via INTRAVENOUS

## 2024-01-02 MED ORDER — EPHEDRINE SULFATE (PRESSORS) 25 MG/5ML IV SOSY
PREFILLED_SYRINGE | INTRAVENOUS | Status: DC | PRN
  Administered 2024-01-02 (×3): 5 mg via INTRAVENOUS
  Administered 2024-01-02: 30 mg via INTRAVENOUS

## 2024-01-02 MED ORDER — FENTANYL CITRATE (PF) 250 MCG/5ML IJ SOLN
INTRAMUSCULAR | Status: DC | PRN
  Administered 2024-01-02: 100 ug via INTRAVENOUS

## 2024-01-02 MED ORDER — CELECOXIB 200 MG PO CAPS
ORAL_CAPSULE | ORAL | Status: AC
  Administered 2024-01-02: 200 mg via ORAL
  Filled 2024-01-02: qty 1

## 2024-01-02 MED ORDER — ALBUMIN HUMAN 5 % IV SOLN: INTRAVENOUS | Status: DC | PRN

## 2024-01-02 MED ORDER — LACTATED RINGERS IV SOLN: INTRAVENOUS | Status: DC

## 2024-01-02 MED ORDER — PROPOFOL 10 MG/ML IV BOLUS
INTRAVENOUS | Status: AC
  Filled 2024-01-02: qty 20

## 2024-01-02 MED ORDER — LIDOCAINE HCL (CARDIAC) PF 100 MG/5ML IV SOSY
PREFILLED_SYRINGE | INTRAVENOUS | Status: DC | PRN
  Administered 2024-01-02: 100 mg via INTRAVENOUS

## 2024-01-02 MED ORDER — SUGAMMADEX SODIUM 200 MG/2ML IV SOLN
INTRAVENOUS | Status: DC | PRN
  Administered 2024-01-02: 175.2 mg via INTRAVENOUS

## 2024-01-02 MED ORDER — SUGAMMADEX SODIUM 200 MG/2ML IV SOLN
INTRAVENOUS | Status: AC
  Filled 2024-01-02: qty 2

## 2024-01-02 MED ORDER — ACETAMINOPHEN 500 MG PO TABS
ORAL_TABLET | ORAL | Status: AC
  Administered 2024-01-02: 1000 mg via ORAL
  Filled 2024-01-02: qty 2

## 2024-01-02 MED ORDER — OXYCODONE HCL 5 MG PO TABS: 5.0000 mg | ORAL_TABLET | Freq: Once | ORAL | Status: AC | PRN

## 2024-01-02 MED ORDER — CEFAZOLIN SODIUM-DEXTROSE 2-4 GM/100ML-% IV SOLN
2.0000 g | INTRAVENOUS | Status: AC
  Administered 2024-01-02: 2 g via INTRAVENOUS

## 2024-01-02 MED ORDER — CHLORHEXIDINE GLUCONATE 0.12 % MT SOLN
OROMUCOSAL | Status: AC
  Administered 2024-01-02: 15 mL via OROMUCOSAL
  Filled 2024-01-02: qty 15

## 2024-01-02 MED ORDER — FENTANYL CITRATE (PF) 100 MCG/2ML IJ SOLN
INTRAMUSCULAR | Status: AC
  Filled 2024-01-02: qty 2

## 2024-01-02 MED ORDER — ORAL CARE MOUTH RINSE: 15.0000 mL | Freq: Once | OROMUCOSAL | Status: AC

## 2024-01-02 MED ORDER — MIDAZOLAM HCL 2 MG/2ML IJ SOLN
INTRAMUSCULAR | Status: AC
  Filled 2024-01-02: qty 2

## 2024-01-02 MED ORDER — CHLORHEXIDINE GLUCONATE 0.12 % MT SOLN: 15.0000 mL | Freq: Once | OROMUCOSAL | Status: AC

## 2024-01-02 MED ORDER — PHENYLEPHRINE 80 MCG/ML (10ML) SYRINGE FOR IV PUSH (FOR BLOOD PRESSURE SUPPORT)
PREFILLED_SYRINGE | INTRAVENOUS | Status: DC | PRN
  Administered 2024-01-02 (×3): 160 ug via INTRAVENOUS
  Administered 2024-01-02: 80 ug via INTRAVENOUS
  Administered 2024-01-02: 160 ug via INTRAVENOUS
  Administered 2024-01-02: 80 ug via INTRAVENOUS

## 2024-01-02 MED ORDER — PROPOFOL 10 MG/ML IV BOLUS
INTRAVENOUS | Status: DC | PRN
  Administered 2024-01-02: 150 mg via INTRAVENOUS

## 2024-01-02 MED ORDER — ONDANSETRON HCL 4 MG/2ML IJ SOLN
INTRAMUSCULAR | Status: AC
  Filled 2024-01-02: qty 2

## 2024-01-02 MED ORDER — OXYCODONE HCL 5 MG/5ML PO SOLN
ORAL | Status: AC
  Filled 2024-01-02: qty 5

## 2024-01-02 MED ORDER — OXYCODONE HCL 5 MG/5ML PO SOLN
5.0000 mg | Freq: Once | ORAL | Status: AC | PRN
  Administered 2024-01-02: 5 mg via ORAL

## 2024-01-02 MED ORDER — CEFAZOLIN SODIUM-DEXTROSE 2-4 GM/100ML-% IV SOLN
INTRAVENOUS | Status: AC
  Filled 2024-01-02: qty 100

## 2024-01-02 MED ADMIN — Sodium Chloride Irrigation Soln 0.9%: 1000 mL | NDC 99999050048

## 2024-01-02 MED ADMIN — Bupivacaine Inj 0.25% w/ Epinephrine 1:200000 (PF): 30 mL | NDC 00409904217

## 2024-01-02 MED FILL — Lidocaine HCl Local Soln Prefilled Syringe 100 MG/5ML (2%): INTRAMUSCULAR | Qty: 5 | Status: AC

## 2024-01-02 MED FILL — Rocuronium Bromide IV Soln Pref Syr 100 MG/10ML (10 MG/ML): INTRAVENOUS | Qty: 10 | Status: AC

## 2024-01-02 MED FILL — Midazolam HCl Inj 2 MG/2ML (Base Equivalent): INTRAMUSCULAR | Qty: 2 | Status: CN

## 2024-01-02 MED FILL — Ephedrine Sulf-NaCl PF Pref Syr 50 MG/10ML-0.9% (5 MG/ML): INTRAVENOUS | Qty: 5 | Status: AC

## 2024-01-02 MED FILL — henylephrine-NaCl Pref Syr 0.8 MG/10ML-0.9% (80 MCG/ML): INTRAVENOUS | Qty: 10 | Status: AC

## 2024-01-02 SURGICAL SUPPLY — 32 items
BAG COUNTER SPONGE SURGICOUNT (BAG) ×1 IMPLANT
BLADE CLIPPER SURG (BLADE) IMPLANT
CANISTER SUCTION 3000ML PPV (SUCTIONS) IMPLANT
CHLORAPREP W/TINT 26 (MISCELLANEOUS) ×1 IMPLANT
COVER SURGICAL LIGHT HANDLE (MISCELLANEOUS) ×1 IMPLANT
DERMABOND ADVANCED .7 DNX12 (GAUZE/BANDAGES/DRESSINGS) ×1 IMPLANT
DRAIN PENROSE .5X12 LATEX STL (DRAIN) IMPLANT
DRAPE LAPAROTOMY TRNSV 102X78 (DRAPES) ×1 IMPLANT
ELECTRODE REM PT RTRN 9FT ADLT (ELECTROSURGICAL) ×1 IMPLANT
GAUZE 4X4 16PLY ~~LOC~~+RFID DBL (SPONGE) ×1 IMPLANT
GLOVE BIOGEL PI IND STRL 6 (GLOVE) ×1 IMPLANT
GLOVE BIOGEL PI MICRO STRL 5.5 (GLOVE) ×1 IMPLANT
GOWN STRL REUS W/ TWL LRG LVL3 (GOWN DISPOSABLE) ×2 IMPLANT
KIT BASIN OR (CUSTOM PROCEDURE TRAY) ×1 IMPLANT
KIT TURNOVER KIT B (KITS) ×1 IMPLANT
MESH ULTRAPRO 3X6 7.6X15CM (Mesh General) IMPLANT
NDL HYPO 25GX1X1/2 BEV (NEEDLE) ×1 IMPLANT
NEEDLE HYPO 25GX1X1/2 BEV (NEEDLE) ×1 IMPLANT
PACK GENERAL/GYN (CUSTOM PROCEDURE TRAY) ×1 IMPLANT
PAD ARMBOARD POSITIONER FOAM (MISCELLANEOUS) ×1 IMPLANT
SOLN 0.9% NACL POUR BTL 1000ML (IV SOLUTION) ×1 IMPLANT
SPIKE FLUID TRANSFER (MISCELLANEOUS) ×1 IMPLANT
SPONGE INTESTINAL PEANUT (DISPOSABLE) IMPLANT
SPONGE T-LAP 18X18 ~~LOC~~+RFID (SPONGE) ×1 IMPLANT
SUT MNCRL AB 4-0 PS2 18 (SUTURE) ×1 IMPLANT
SUT PDS AB 0 CT1 27 (SUTURE) ×2 IMPLANT
SUT PDS PLUS AB 0 CT-2 (SUTURE) IMPLANT
SUT VIC AB 2-0 SH 27X BRD (SUTURE) ×1 IMPLANT
SUT VIC AB 3-0 SH 27X BRD (SUTURE) ×2 IMPLANT
SYR CONTROL 10ML LL (SYRINGE) ×1 IMPLANT
TOWEL GREEN STERILE (TOWEL DISPOSABLE) ×1 IMPLANT
TOWEL GREEN STERILE FF (TOWEL DISPOSABLE) ×1 IMPLANT

## 2024-01-02 NOTE — Anesthesia Procedure Notes (Signed)
 Procedure Name: Intubation Date/Time: 01/02/2024 11:43 AM  Performed by: Harrold Macintosh, CRNAPre-anesthesia Checklist: Patient identified, Emergency Drugs available, Suction available and Patient being monitored Patient Re-evaluated:Patient Re-evaluated prior to induction Oxygen Delivery Method: Circle system utilized Preoxygenation: Pre-oxygenation with 100% oxygen Induction Type: IV induction Ventilation: Mask ventilation without difficulty Laryngoscope Size: Miller and 3 Grade View: Grade II Tube type: Oral Tube size: 7.5 mm Number of attempts: 1 Airway Equipment and Method: Stylet and Bite block Placement Confirmation: ETT inserted through vocal cords under direct vision, positive ETCO2 and breath sounds checked- equal and bilateral Secured at: 23 cm Tube secured with: Tape Dental Injury: Teeth and Oropharynx as per pre-operative assessment

## 2024-01-02 NOTE — Op Note (Signed)
 Date: 01/02/2024  Patient: Ralph [name] MRN: [id number]  Preoperative Diagnosis: Right inguinal hernia Postoperative Diagnosis: Direct right inguinal hernia  Procedure: Open right inguinal hernia repair with mesh patch  Surgeon: Leonor Dawn, MD  EBL: Minimal  Anesthesia: General endotracheal  Specimens: None  Indications: [name] is a 68 yo male who developed a bulge in the right groin several months ago. He has been having frequent symptoms since then, and exam confirmed an inguinal hernia. After a discussion of the risks and benefits of surgery, he consented to proceed with repair.  Findings: Direct right inguinal hernia, repaired with an Ultrapro mesh patch. No indirect inguinal hernia.  Procedure details: Informed consent was obtained in the preoperative area prior to the procedure. The patient was brought to the operating room and placed on the table in the supine position. General anesthesia was induced and appropriate lines and drains were placed for intraoperative monitoring. Perioperative antibiotics were administered per SCIP guidelines. The abdomen was prepped and draped in the usual sterile fashion. A pre-procedure timeout was taken verifying patient identity, surgical site and procedure to be performed.  The ASIS and pubic tubercle were identified, and a transverse incision was made in the right groin two fingerbreadths superior to the pubic tubercle. The subcutaneous tissue and Scarpa's fascia were divided with cautery to expose the external oblique fascia. The fascia was incised with a 15-blade scalpel and opened medially to the external inguinal ring using metzenbaum scissors, taking care to protect the ilioinguinal nerve. The cord structures were circumferentially dissected out using gentle blunt dissection, and encircled with a penrose. The ilioinguinal nerve was visualized and protected. There was a direct hernia defect, which contained  preperitoneal fat. The cord structures were thoroughly examined and no indirect hernia sac was identified. The hernia sac and its contents were reduced, and the shelving edge of the inguinal ligament was approximated with the conjoint tendon using a 0 PDS figure-of-eight suture, to keep the sac reduced. A sheet of Ultrapro mesh was then brought onto the field and cut to size, with a slit to accommodate the cord structures. The mesh was secured medially to the pubic tubercle and inferiorly to the shelving edge of the inguinal ligament, using a running 0 PDS suture. Superiorly the mesh was secured to the conjoint tendon using interrupted 0 PDS. The tails of the mesh were sutured together laterally to recreate the internal inguinal ring. The surgical site was thoroughly irrigated with saline and appeared hemostatic. The external oblique fascia was closed with a running 2-0 Vicryl suture. Scarpa's fascia was closed a running 3-0 Vicryl, and the deep dermis was closed with interrupted 3-0 Vicryl sutures. The skin was closed with a running subcuticular 4-0 monocryl suture. Dermabond was applied.  The patient tolerated the procedure well with no apparent complications. All counts were correct x2 at the end of the procedure. The patient was extubated and taken to PACU in stable condition.  Leonor Dawn, MD 01/02/2024 1:48 PM

## 2024-01-02 NOTE — Discharge Instructions (Addendum)
 [facility] DISCHARGE INSTRUCTIONS  Activity No heavy lifting greater than 15 pounds for 8 weeks after surgery. Ok to shower in 24 hours, but do not bathe or submerge incisions underwater. Do not drive while taking narcotic pain medication. You may drive when you are no longer taking prescription pain medication, you can comfortably wear a seatbelt, and you can safely maneuver your car and apply brakes.  Wound Care Your incisions are covered with skin glue called Dermabond. This will peel off on its own over time. You may shower and allow warm soapy water to run over your incisions. Gently pat dry. Do not submerge your incision underwater until cleared by your surgeon. Monitor your incision for any new redness, tenderness, or drainage. Many patients will experience some swelling and bruising at the incisions and in the scrotum.  Ice packs will help.  Swelling and bruising can take several days to resolve.   Medications A  prescription for pain medication may be given to you upon discharge.  Take your pain medication as prescribed, if needed.  If narcotic pain medicine is not needed, then you may take acetaminophen (Tylenol) or ibuprofen (Advil) as needed. It is common to experience some constipation if taking pain medication after surgery.  Increasing fluid intake and taking a stool softener (such as Colace) will usually help or prevent this problem from occurring.  A mild laxative (Milk of Magnesia or Miralax) should be taken according to package directions if there are no bowel movements after 48 hours. Take your usually prescribed medications unless otherwise directed. If you need a refill on your pain medication, please contact your pharmacy.  They will contact our office to request authorization. Prescriptions will not be filled after 5 pm or on weekends.  When to Call Us : Fever greater than 100.5 New redness, drainage, or swelling at incision site Severe pain, nausea,  or vomiting Persistent bleeding from incisions Difficulty urinating  Follow-up You have an appointment scheduled with Dr. Dasie on January 30, 2024 at 10:20am. This will be at the [facility] office at [address], [address], [place], [place]. Please arrive at least 15 minutes prior to your scheduled appointment time.  IF YOU HAVE DISABILITY OR FAMILY LEAVE FORMS, YOU MUST BRING THEM TO THE OFFICE FOR PROCESSING.   DO NOT GIVE THEM TO YOUR DOCTOR.  The clinic staff is available to answer your questions during regular business hours.  Please dont hesitate to call and ask to speak to one of the nurses for clinical concerns.  If you have a medical emergency, go to the nearest emergency room or call 911.  A surgeon from [facility] is always on call at the hospital  [address], [place], [place]  [address] ?  [address] [address], [place], [place]   [address] [phone] ? Toll Free: [phone] ? FAX [phone] Web site: [email or web address]      Managing Your Pain After Surgery Without Opioids    Thank you for participating in our program to help patients manage their pain after surgery without opioids. This is part of our effort to provide you with the best care possible, without exposing you or your family to the risk that opioids pose.  What pain can I expect after surgery? You can expect to have some pain after surgery. This is normal. The pain is typically worse the day after surgery, and quickly begins to get better. Many studies have found that many patients  are able to manage their pain after surgery with Over-the-Counter (OTC) medications such as Tylenol and Motrin. If you have a condition that does not allow you to take Tylenol or Motrin, notify your surgical team.  How will I manage my pain? The best strategy for controlling your pain after surgery is around the clock pain control with Tylenol (acetaminophen) and Motrin  (ibuprofen or Advil). Alternating these medications with each other allows you to maximize your pain control. In addition to Tylenol and Motrin, you can use heating pads or ice packs on your incisions to help reduce your pain.  How will I alternate your regular strength over-the-counter pain medication? You will take a dose of pain medication every three hours. Start by taking 650 mg of Tylenol (2 pills of 325 mg) 3 hours later take 600 mg of Motrin (3 pills of 200 mg) 3 hours after taking the Motrin take 650 mg of Tylenol 3 hours after that take 600 mg of Motrin.   - 1 -  See example - if your first dose of Tylenol is at 12:00 PM   12:00 PM Tylenol 650 mg (2 pills of 325 mg)  3:00 PM Motrin 600 mg (3 pills of 200 mg)  6:00 PM Tylenol 650 mg (2 pills of 325 mg)  9:00 PM Motrin 600 mg (3 pills of 200 mg)  Continue alternating every 3 hours   We recommend that you follow this schedule around-the-clock for at least 3 days after surgery, or until you feel that it is no longer needed. Use the table on the last page of this handout to keep track of the medications you are taking. Important: Do not take more than 3000mg  of Tylenol or 3200mg  of Motrin in a 24-hour period. Do not take ibuprofen/Motrin if you have a history of bleeding stomach ulcers, severe kidney disease, &/or actively taking a blood thinner  What if I still have pain? If you have pain that is not controlled with the over-the-counter pain medications (Tylenol and Motrin or Advil) you might have what we call breakthrough pain. You will receive a prescription for a small amount of an opioid pain medication such as Oxycodone, Tramadol, or Tylenol with Codeine. Use these opioid pills in the first 24 hours after surgery if you have breakthrough pain. Do not take more than 1 pill every 4-6 hours.  If you still have uncontrolled pain after using all opioid pills, don't hesitate to call our staff using the number provided. We will  help make sure you are managing your pain in the best way possible, and if necessary, we can provide a prescription for additional pain medication.   Day 1    Time  Name of Medication Number of pills taken  Amount of Acetaminophen  Pain Level   Comments  AM PM       AM PM       AM PM       AM PM       AM PM       AM PM       AM PM       AM PM       Total Daily amount of Acetaminophen Do not take more than  3,000 mg per day      Day 2    Time  Name of Medication Number of pills taken  Amount of Acetaminophen  Pain Level   Comments  AM PM       AM  PM       AM PM       AM PM       AM PM       AM PM       AM PM       AM PM       Total Daily amount of Acetaminophen Do not take more than  3,000 mg per day      Day 3    Time  Name of Medication Number of pills taken  Amount of Acetaminophen  Pain Level   Comments  AM PM       AM PM       AM PM       AM PM         AM PM       AM PM       AM PM       AM PM       Total Daily amount of Acetaminophen Do not take more than  3,000 mg per day      Day 4    Time  Name of Medication Number of pills taken  Amount of Acetaminophen  Pain Level   Comments  AM PM       AM PM       AM PM       AM PM       AM PM       AM PM       AM PM       AM PM       Total Daily amount of Acetaminophen Do not take more than  3,000 mg per day      Day 5    Time  Name of Medication Number of pills taken  Amount of Acetaminophen  Pain Level   Comments  AM PM       AM PM       AM PM       AM PM       AM PM       AM PM       AM PM       AM PM       Total Daily amount of Acetaminophen Do not take more than  3,000 mg per day      Day 6    Time  Name of Medication Number of pills taken  Amount of Acetaminophen  Pain Level  Comments  AM PM       AM PM       AM PM       AM PM       AM PM       AM PM       AM PM       AM PM       Total Daily amount of Acetaminophen Do not take more than   3,000 mg per day      Day 7    Time  Name of Medication Number of pills taken  Amount of Acetaminophen  Pain Level   Comments  AM PM       AM PM       AM PM       AM PM       AM PM       AM PM       AM PM       AM PM  Total Daily amount of Acetaminophen Do not take more than  3,000 mg per day        For additional information about how and where to safely dispose of unused opioid medications - prankcrew.uy  Disclaimer: This document contains information and/or instructional materials adapted from [facility] for the typical patient with your condition. It does not replace medical advice from your health care provider because your experience may differ from that of the typical patient. Talk to your health care provider if you have any questions about this document, your condition or your treatment plan. Adapted from [facility]

## 2024-01-02 NOTE — Interval H&P Note (Signed)
 History and Physical Interval Note:  01/02/2024 11:25 AM  Ralph [name]  has presented today for surgery, with the diagnosis of RIGHT INGUINAL HERNIA.  The various methods of treatment have been discussed with the patient and family. After consideration of risks, benefits and other options for treatment, the patient has consented to  Procedures with comments: REPAIR, HERNIA, INGUINAL, ADULT (Right) - OPEN, RIGHT INGUINAL HERNIA, MESH as a surgical intervention. Surgical site confirmed with the patient and marked. The patient's history has been reviewed, patient examined, no change in status, stable for surgery.  I have reviewed the patient's chart and labs.  Questions were answered to the patient's satisfaction.     Leonor LITTIE Dawn

## 2024-01-02 NOTE — Anesthesia Preprocedure Evaluation (Signed)
 Anesthesia Evaluation  Patient identified by MRN, date of birth, ID band Patient awake    Reviewed: Allergy & Precautions, NPO status , Patient's Chart, lab work & pertinent test results, reviewed documented beta blocker date and time   History of Anesthesia Complications Negative for: history of anesthetic complications  Airway Mallampati: II  TM Distance: >3 FB     Dental no notable dental hx.    Pulmonary neg COPD   breath sounds clear to auscultation       Cardiovascular (-) angina (-) CAD and (-) Past MI  Rhythm:Regular Rate:Normal  HLD   Neuro/Psych neg Seizures    GI/Hepatic ,neg GERD  ,,(+) neg Cirrhosis      Ingunial hernia without obstruction   Endo/Other    Renal/GU Renal disease     Musculoskeletal   Abdominal   Peds  Hematology   Anesthesia Other Findings   Reproductive/Obstetrics                              Anesthesia Physical Anesthesia Plan  ASA: 1  Anesthesia Plan: General   Post-op Pain Management:    Induction: Intravenous  PONV Risk Score and Plan: 2 and Ondansetron and Dexamethasone   Airway Management Planned: Oral ETT  Additional Equipment:   Intra-op Plan:   Post-operative Plan: Extubation in OR  Informed Consent: I have reviewed the patients History and Physical, chart, labs and discussed the procedure including the risks, benefits and alternatives for the proposed anesthesia with the patient or authorized representative who has indicated his/her understanding and acceptance.     Dental advisory given  Plan Discussed with: CRNA  Anesthesia Plan Comments:          Anesthesia Quick Evaluation

## 2024-01-02 NOTE — Progress Notes (Signed)
 50 mcg/0.5 mL Fentanyl wasted post patient discharge.   Delon HERO, PACU RN witnessed disposal

## 2024-01-02 NOTE — Transfer of Care (Signed)
 Immediate Anesthesia Transfer of Care Note  Patient: Ralph [name]  Procedure(s) Performed: REPAIR, HERNIA, INGUINAL, ADULT (Right: Groin)  Patient Location: PACU  Anesthesia Type:General  Level of Consciousness: drowsy, patient cooperative, and responds to stimulation  Airway & Oxygen Therapy: Patient Spontanous Breathing and Patient connected to nasal cannula oxygen  Post-op Assessment: Report given to RN, Post -op Vital signs reviewed and stable, Patient moving all extremities X 4, and Patient able to stick tongue midline  Post vital signs: Reviewed and stable  Last Vitals:  Vitals Value Taken Time  BP 136/78 01/02/24 12:56  Temp 97.6   Pulse 63 01/02/24 12:58  Resp 11 01/02/24 12:58  SpO2 96 % 01/02/24 12:58  Vitals shown include unfiled device data.  Last Pain:  Vitals:   01/02/24 1006  TempSrc:   PainSc: 0-No pain         Complications: No notable events documented.

## 2024-01-02 NOTE — Anesthesia Postprocedure Evaluation (Signed)
 Anesthesia Post Note  Patient: Ralph [name]  Procedure(s) Performed: REPAIR, HERNIA, INGUINAL, ADULT (Right: Groin)     Patient location during evaluation: PACU Anesthesia Type: General Level of consciousness: awake and alert Pain management: pain level controlled Vital Signs Assessment: post-procedure vital signs reviewed and stable Respiratory status: spontaneous breathing, nonlabored ventilation and respiratory function stable Cardiovascular status: blood pressure returned to baseline Postop Assessment: no apparent nausea or vomiting Anesthetic complications: no   No notable events documented.  Last Vitals:  Vitals:   01/02/24 1330 01/02/24 1345  BP: 119/78 118/86  Pulse: 61 (!) 57  Resp: (!) 7 10  Temp:    SpO2: 93% 95%    Last Pain:  Vitals:   01/02/24 1315  TempSrc:   PainSc: 7                  Vertell Row

## 2024-01-05 ENCOUNTER — Other Ambulatory Visit: Payer: Self-pay

## 2024-01-05 ENCOUNTER — Encounter (HOSPITAL_COMMUNITY): Payer: Self-pay | Admitting: Surgery

## 2024-01-05 DIAGNOSIS — E782 Mixed hyperlipidemia: Secondary | ICD-10-CM

## 2024-01-05 MED ORDER — ATORVASTATIN CALCIUM 20 MG PO TABS: 20.0000 mg | ORAL_TABLET | Freq: Every day | ORAL | 3 refills | Status: AC

## 2024-01-05 NOTE — Telephone Encounter (Signed)
 MyChart message read.
